# Patient Record
Sex: Female | Born: 1995 | Race: White | Hispanic: Yes | Marital: Single | Smoking: Never smoker
Health system: Southern US, Community
[De-identification: ages and names within clinical notes are randomized; demographics above are authoritative.]

## PROBLEM LIST (undated history)

## (undated) DIAGNOSIS — J45909 Unspecified asthma, uncomplicated: Secondary | ICD-10-CM

## (undated) DIAGNOSIS — J40 Bronchitis, not specified as acute or chronic: Secondary | ICD-10-CM

## (undated) HISTORY — PX: NO PAST SURGERIES: SHX2092

---
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
## 2018-03-05 ENCOUNTER — Encounter (HOSPITAL_COMMUNITY): Payer: Self-pay | Admitting: Emergency Medicine

## 2018-11-05 LAB — OB RESULTS CONSOLE GC/CHLAMYDIA
Chlamydia: NEGATIVE
Gonorrhea: NEGATIVE

## 2018-11-05 LAB — OB RESULTS CONSOLE PLATELET COUNT: Platelets: 286

## 2018-11-05 LAB — OB RESULTS CONSOLE HIV ANTIBODY (ROUTINE TESTING): HIV: NONREACTIVE

## 2018-11-05 LAB — OB RESULTS CONSOLE VARICELLA ZOSTER ANTIBODY, IGG: Varicella: IMMUNE

## 2018-11-05 LAB — OB RESULTS CONSOLE HGB/HCT, BLOOD
HCT: 42 — AB (ref 29–41)
Hemoglobin: 14.1

## 2018-11-05 LAB — OB RESULTS CONSOLE ABO/RH: RH Type: POSITIVE

## 2018-11-05 LAB — OB RESULTS CONSOLE RUBELLA ANTIBODY, IGM: Rubella: IMMUNE

## 2018-11-05 LAB — OB RESULTS CONSOLE ANTIBODY SCREEN: Antibody Screen: NEGATIVE

## 2018-11-05 LAB — OB RESULTS CONSOLE HEPATITIS B SURFACE ANTIGEN: Hepatitis B Surface Ag: NEGATIVE

## 2018-11-16 ENCOUNTER — Other Ambulatory Visit (HOSPITAL_COMMUNITY): Payer: Self-pay | Admitting: Family

## 2018-11-16 ENCOUNTER — Encounter (HOSPITAL_COMMUNITY): Payer: Self-pay | Admitting: *Deleted

## 2018-11-16 DIAGNOSIS — Z3A13 13 weeks gestation of pregnancy: Secondary | ICD-10-CM

## 2018-11-16 DIAGNOSIS — Z369 Encounter for antenatal screening, unspecified: Secondary | ICD-10-CM

## 2018-11-21 ENCOUNTER — Other Ambulatory Visit: Payer: Self-pay

## 2018-11-21 ENCOUNTER — Ambulatory Visit (HOSPITAL_COMMUNITY): Payer: Self-pay | Admitting: *Deleted

## 2018-11-21 ENCOUNTER — Encounter (HOSPITAL_COMMUNITY): Payer: Self-pay

## 2018-11-21 ENCOUNTER — Ambulatory Visit (HOSPITAL_COMMUNITY)
Admission: RE | Admit: 2018-11-21 | Discharge: 2018-11-21 | Disposition: A | Discharge status: Home / Self Care | Payer: Self-pay | Source: Ambulatory Visit | Attending: Obstetrics and Gynecology | Admitting: Obstetrics and Gynecology

## 2018-11-21 ENCOUNTER — Ambulatory Visit (HOSPITAL_COMMUNITY): Payer: Self-pay

## 2018-11-21 VITALS — BP 107/62 | HR 104 | Temp 98.4°F | Wt 144.6 lb

## 2018-11-21 DIAGNOSIS — Z3682 Encounter for antenatal screening for nuchal translucency: Secondary | ICD-10-CM

## 2018-11-21 DIAGNOSIS — Z3A13 13 weeks gestation of pregnancy: Secondary | ICD-10-CM | POA: Insufficient documentation

## 2018-11-21 DIAGNOSIS — Z369 Encounter for antenatal screening, unspecified: Secondary | ICD-10-CM | POA: Insufficient documentation

## 2018-11-23 LAB — FIRST TRIMESTER SCREEN W/NT
CRL: 67.9 mm
DIA MoM: 1.21
DIA Value: 275.3 pg/mL
Gest Age-Collect: 12.9 weeks
Maternal Age At EDD: 21.4 yr
Nuchal Translucency MoM: 0.99
Nuchal Translucency: 1.8 mm
Number of Fetuses: 1
PAPP-A MoM: 1.1
PAPP-A Value: 1390 ng/mL
Test Results:: NEGATIVE
Weight: 144 [lb_av]
hCG MoM: 1.16
hCG Value: 103.2 IU/mL

## 2018-11-26 ENCOUNTER — Telehealth (HOSPITAL_COMMUNITY): Payer: Self-pay | Admitting: Genetic Counselor

## 2018-11-26 NOTE — Telephone Encounter (Signed)
Called Ms. Dominguez-Roda with help of [place] interpreter Harrell Gave [phone] to discuss her negative first trimester screen results. The risk for her pregnancy to be affected by Down syndrome decreased from her 1 in 1024 age-related risk to less than 1 in 10,000, and the risk for trisomy 18 decreased from her 1 in 2005 age-related risk to less than 1 in 10,000 based on the results of this screen. While this screen significantly reduces the likelihood of the pregnancy being affected by trisomy 21 or trisomy 28, it cannot be considered diagnostic. Diagnostic testing via CVS or amniocentesis is available should she be interested in pursuing this. First trimester screening does not screen for open neural tube defects such as spina bifida, so it is recommended that AFP screening be ordered around 16-18 weeks. Ms. Angelia Mould confirmed that she had no questions about these results.  Buelah Manis, MS Genetic Counselor

## 2018-12-07 ENCOUNTER — Other Ambulatory Visit (HOSPITAL_COMMUNITY): Payer: Self-pay | Admitting: Obstetrics and Gynecology

## 2019-03-14 LAB — OB RESULTS CONSOLE RPR: RPR: NONREACTIVE

## 2019-03-15 NOTE — L&D Delivery Note (Addendum)
Delivery Note At 12:32 PM a viable female was delivered via Vaginal, Spontaneous (Presentation: Left Occiput Anterior).  APGAR: 8, 9; weight 7 lb (3175 g).   Placenta status: Spontaneous, Intact.  Cord: 3 vessels with the following complications: None.  Cord pH: NA. One loose nuchal cord and body cord delivered through.    After delivery, patient had brisk bright red bleeding, although fundus was firm. 800 mcg of cytotec placed rectally and methergine given IM. Dr. Penne Lash called to exam patient, who did manual exploration and removed several clots from uterus; patient also received TXA and hemabate x 1. Foley catheter placed, 2nd IV started and PPH labs drawn. After clots manually removed from uterus, bleeding stopped. Patient VSS throughout, infant at the warmer in care of RN.    Anesthesia: Epidural Episiotomy: None Lacerations:  Left labial Suture Repair: 3.0 vicryl Est. Blood Loss (mL):  943  Mom to postpartum.  Baby to Couplet care / Skin to Skin. -Keep patient's Foley in until patient is able to ambulate and bleeding has ceased, anticipate removal this evening.  -Check CBC in 4 hours (1830).   Charlesetta Garibaldi Lynn Sissel 05/10/2019, 2:24 PM

## 2019-05-09 ENCOUNTER — Inpatient Hospital Stay (HOSPITAL_COMMUNITY)
Admission: AD | Admit: 2019-05-09 | Discharge: 2019-05-11 | DRG: 806 | Disposition: A | Discharge status: Home / Self Care | Payer: Medicaid Other | Attending: Obstetrics & Gynecology | Admitting: Obstetrics & Gynecology

## 2019-05-09 ENCOUNTER — Other Ambulatory Visit: Payer: Self-pay

## 2019-05-09 ENCOUNTER — Encounter (HOSPITAL_COMMUNITY): Payer: Self-pay | Admitting: Obstetrics and Gynecology

## 2019-05-09 DIAGNOSIS — Z3A37 37 weeks gestation of pregnancy: Secondary | ICD-10-CM | POA: Diagnosis not present

## 2019-05-09 DIAGNOSIS — O4292 Full-term premature rupture of membranes, unspecified as to length of time between rupture and onset of labor: Principal | ICD-10-CM | POA: Diagnosis present

## 2019-05-09 DIAGNOSIS — Z8759 Personal history of other complications of pregnancy, childbirth and the puerperium: Secondary | ICD-10-CM

## 2019-05-09 DIAGNOSIS — O4202 Full-term premature rupture of membranes, onset of labor within 24 hours of rupture: Secondary | ICD-10-CM | POA: Diagnosis not present

## 2019-05-09 DIAGNOSIS — Z3689 Encounter for other specified antenatal screening: Secondary | ICD-10-CM | POA: Diagnosis not present

## 2019-05-09 DIAGNOSIS — Z20822 Contact with and (suspected) exposure to covid-19: Secondary | ICD-10-CM | POA: Diagnosis present

## 2019-05-09 DIAGNOSIS — Z3490 Encounter for supervision of normal pregnancy, unspecified, unspecified trimester: Secondary | ICD-10-CM

## 2019-05-09 LAB — GROUP B STREP BY PCR: Group B strep by PCR: POSITIVE — AB

## 2019-05-09 LAB — CBC
HCT: 40.3 % (ref 36.0–46.0)
Hemoglobin: 13.6 g/dL (ref 12.0–15.0)
MCH: 31.5 pg (ref 26.0–34.0)
MCHC: 33.7 g/dL (ref 30.0–36.0)
MCV: 93.3 fL (ref 80.0–100.0)
Platelets: 248 10*3/uL (ref 150–400)
RBC: 4.32 MIL/uL (ref 3.87–5.11)
RDW: 12.6 % (ref 11.5–15.5)
WBC: 11.2 10*3/uL — ABNORMAL HIGH (ref 4.0–10.5)
nRBC: 0 % (ref 0.0–0.2)

## 2019-05-09 LAB — TYPE AND SCREEN
ABO/RH(D): B POS
Antibody Screen: NEGATIVE

## 2019-05-09 LAB — SARS CORONAVIRUS 2 (TAT 6-24 HRS): SARS Coronavirus 2: NEGATIVE

## 2019-05-09 LAB — ABO/RH: ABO/RH(D): B POS

## 2019-05-09 MED ORDER — PENICILLIN G POT IN DEXTROSE 60000 UNIT/ML IV SOLN
3.0000 10*6.[IU] | INTRAVENOUS | Status: DC
  Administered 2019-05-10 (×3): 3 10*6.[IU] via INTRAVENOUS
  Filled 2019-05-09 (×3): qty 50

## 2019-05-09 MED ORDER — LIDOCAINE HCL (PF) 1 % IJ SOLN: 30.0000 mL | INTRAMUSCULAR | Status: DC | PRN

## 2019-05-09 MED ORDER — TERBUTALINE SULFATE 1 MG/ML IJ SOLN: 0.2500 mg | Freq: Once | INTRAMUSCULAR | Status: DC | PRN

## 2019-05-09 MED ORDER — OXYCODONE-ACETAMINOPHEN 5-325 MG PO TABS: 1.0000 | ORAL_TABLET | ORAL | Status: DC | PRN

## 2019-05-09 MED ORDER — OXYTOCIN 40 UNITS IN NORMAL SALINE INFUSION - SIMPLE MED
1.0000 m[IU]/min | INTRAVENOUS | Status: DC
  Administered 2019-05-09: 18:00:00 2 m[IU]/min via INTRAVENOUS
  Filled 2019-05-09: qty 1000

## 2019-05-09 MED ORDER — OXYTOCIN 40 UNITS IN NORMAL SALINE INFUSION - SIMPLE MED
2.5000 [IU]/h | INTRAVENOUS | Status: DC
  Filled 2019-05-09: qty 1000

## 2019-05-09 MED ORDER — OXYCODONE-ACETAMINOPHEN 5-325 MG PO TABS: 2.0000 | ORAL_TABLET | ORAL | Status: DC | PRN

## 2019-05-09 MED ORDER — SOD CITRATE-CITRIC ACID 500-334 MG/5ML PO SOLN
30.0000 mL | ORAL | Status: DC | PRN
  Administered 2019-05-09: 30 mL via ORAL
  Filled 2019-05-09: qty 30

## 2019-05-09 MED ORDER — SODIUM CHLORIDE 0.9 % IV SOLN
5.0000 10*6.[IU] | Freq: Once | INTRAVENOUS | Status: AC
  Administered 2019-05-09: 21:00:00 5 10*6.[IU] via INTRAVENOUS
  Filled 2019-05-09: qty 5

## 2019-05-09 MED ORDER — LACTATED RINGERS IV SOLN
500.0000 mL | INTRAVENOUS | Status: DC | PRN
  Administered 2019-05-10: 07:00:00 500 mL via INTRAVENOUS

## 2019-05-09 MED ORDER — LACTATED RINGERS IV SOLN: INTRAVENOUS | Status: DC

## 2019-05-09 MED ORDER — ACETAMINOPHEN 325 MG PO TABS: 650.0000 mg | ORAL_TABLET | ORAL | Status: DC | PRN

## 2019-05-09 MED ORDER — ONDANSETRON HCL 4 MG/2ML IJ SOLN
4.0000 mg | Freq: Four times a day (QID) | INTRAMUSCULAR | Status: DC | PRN
  Administered 2019-05-10 (×2): 4 mg via INTRAVENOUS
  Filled 2019-05-09 (×2): qty 2

## 2019-05-09 MED ORDER — OXYTOCIN BOLUS FROM INFUSION
500.0000 mL | Freq: Once | INTRAVENOUS | Status: AC
  Administered 2019-05-10: 13:00:00 500 mL via INTRAVENOUS

## 2019-05-09 MED ORDER — FLEET ENEMA 7-19 GM/118ML RE ENEM: 1.0000 | ENEMA | RECTAL | Status: DC | PRN

## 2019-05-09 NOTE — H&P (Addendum)
OBSTETRIC ADMISSION HISTORY AND PHYSICAL  Judith Collins is a 24 y.o. female G1P0 with IUP at [redacted]w[redacted]d by LMP c/w early u/s presenting for PROM at term. She reports loss of fluid since Friday (2/19). She reports +FMs, no VB, no blurry vision, headaches or peripheral edema, no RUQ pain.  She plans on breast feeding. She request POPs for birth control. She [facility].   Dating: By LMP c/w early u/s --->  Estimated Date of Delivery: 05/29/19  Sono:    @[redacted]w[redacted]d , CWD, 90mm UTD  Prenatal History/Complications: None  Past Medical History: Past Medical History:  Diagnosis Date  . Bronchitis     Past Surgical History: Past Surgical History:  Procedure Laterality Date  . NO PAST SURGERIES      Obstetrical History: OB History    Gravida  1   Para      Term      Preterm      AB      Living        SAB      TAB      Ectopic      Multiple      Live Births              Social History Social History   Socioeconomic History  . Marital status: Single    Spouse name: Not on file  . Number of children: Not on file  . Years of education: Not on file  . Highest education level: Not on file  Occupational History  . Not on file  Tobacco Use  . Smoking status: Never Smoker  . Smokeless tobacco: Never Used  Substance and Sexual Activity  . Alcohol use: No  . Drug use: No  . Sexual activity: Yes  Other Topics Concern  . Not on file  Social History Narrative   ** Merged History Encounter **       Social Determinants of Health   Financial Resource Strain:   . Difficulty of Paying Living Expenses: Not on file  Food Insecurity:   . Worried About Charity fundraiser in the Last Year: Not on file  . Ran Out of Food in the Last Year: Not on file  Transportation Needs:   . Lack of Transportation (Medical): Not on file  . Lack of Transportation (Non-Medical): Not on file  Physical Activity:   . Days of Exercise per  Week: Not on file  . Minutes of Exercise per Session: Not on file  Stress:   . Feeling of Stress : Not on file  Social Connections:   . Frequency of Communication with Friends and Family: Not on file  . Frequency of Social Gatherings with Friends and Family: Not on file  . Attends Religious Services: Not on file  . Active Member of Clubs or Organizations: Not on file  . Attends Archivist Meetings: Not on file  . Marital Status: Not on file    Family History: Family History  Problem Relation Age of Onset  . Asthma Mother     Allergies: Allergies  Allergen Reactions  . Lactose Intolerance (Gi)     Medications Prior to Admission  Medication Sig Dispense Refill Last Dose  . Prenatal Vit-Fe Fumarate-FA (PRENATAL VITAMIN PO) Take by mouth.   05/09/2019 at Unknown time  . mupirocin nasal ointment (BACTROBAN) 2 % Apply in each nostril daily (Patient not taking: Reported on 11/21/2018) 1 g 0   . phenol (  CHLORASEPTIC) 1.4 % LIQD Use as directed 1 spray in the mouth or throat as needed for throat irritation / pain. (Patient not taking: Reported on 11/21/2018) 177 mL 0   . polyethylene glycol powder (GLYCOLAX/MIRALAX) powder Take 17 g by mouth daily as needed. (Patient not taking: Reported on 03/13/2015) 255 g 0   . ranitidine (ZANTAC) 150 MG tablet Take 1 tablet (150 mg total) by mouth 2 (two) times daily. (Patient not taking: Reported on 11/21/2018) 14 tablet 3      Review of Systems   All systems reviewed and negative except as stated in HPI  Blood pressure 114/69, pulse (!) 105, temperature 98.8 F (37.1 C), temperature source Oral, resp. rate 18, height 5\' 2"  (1.575 m), weight 81.2 kg, last menstrual period 08/22/2018. General appearance: alert, cooperative and no distress Lungs: clear to auscultation bilaterally Heart: regular rate and rhythm Abdomen: soft, non-tender; bowel sounds normal Pelvic: 3.5cm, 90%, -1 Extremities: Homans sign is negative, no sign of  DVT Presentation: cephalic Fetal monitoringBaseline: 150 bpm, Variability: Good {> 6 bpm) and Accelerations: present Uterine activity: Contractions rare Dilation: 3.5 Effacement (%): 90 Station: -1 Exam by:: [email or web address] CNM  Vertex  Prenatal labs: ABO, Rh: B pos Antibody: PENDING (02/25 1728) Rubella: Immune (08/24 0000) RPR: Nonreactive (12/31 0000)  HBsAg: Negative (08/24 0000)  HIV: Non-reactive (08/24 0000)  GBS:  unknown 1 hr Glucola 89 Genetic screening  Low risk Anatomy 02-26-1987 63mm UTD  Prenatal Transfer Tool  Maternal Diabetes: No Genetic Screening: Normal Maternal Ultrasounds/Referrals: Other: Not on file. Fetal Ultrasounds or other Referrals:  None Maternal Substance Abuse:  No Significant Maternal Medications:  None Significant Maternal Lab Results: none  Results for orders placed or performed during the hospital encounter of 05/09/19 (from the past 24 hour(s))  CBC   Collection Time: 05/09/19  5:28 PM  Result Value Ref Range   WBC 11.2 (H) 4.0 - 10.5 K/uL   RBC 4.32 3.87 - 5.11 MIL/uL   Hemoglobin 13.6 12.0 - 15.0 g/dL   HCT 05/11/19 77.8 - 24.2 %   MCV 93.3 80.0 - 100.0 fL   MCH 31.5 26.0 - 34.0 pg   MCHC 33.7 30.0 - 36.0 g/dL   RDW 35.3 61.4 - 43.1 %   Platelets 248 150 - 400 K/uL   nRBC 0.0 0.0 - 0.2 %  Type and screen [facility]   Collection Time: 05/09/19  5:28 PM  Result Value Ref Range   ABO/RH(D) PENDING    Antibody Screen PENDING    Sample Expiration      05/12/2019,2359 Performed at [facility], [address] [place], [place]     Patient Active Problem List   Diagnosis Date Noted  . Encounter for induction of labor 05/09/2019  . Perforated tympanic membrane 02/26/2015  . Failed hearing screening 02/26/2015    Assessment/Plan:  Judith Collins is a 24 y.o. G1P0 at [redacted]w[redacted]d here for PROM at term.  #Labor: Latent. Will initiate pitocin. #Pain: Epidural when indicated #FWB: Cat 1 #ID:  GBS unsure, will order rapid PCR #MOF: Breast #MOC: POPs #Circ:  No  [redacted]w[redacted]d, DO  05/09/2019, 6:13 PM  Midwife attestation: I have seen and examined this patient; I agree with above documentation in the resident's note.   PE: Gen: calm comfortable, NAD Resp: normal effort and rate Abd: gravid  ROS, labs, PMH reviewed  Assessment/Plan: Mohogany Toppins is a 24 y.o. G1P0 here for PROM at  term Admit to LD Labor: latent, favorable cervix FWB: Cat I GBS unknown Pitocin IOL Anticipate SVD Live interpreter present for encounter  Donette Larry, CNM  05/09/2019, 7:07 PM

## 2019-05-09 NOTE — Progress Notes (Signed)
Labor Progress Note Judith Collins Records is a 24 y.o. G1P0 at 73w1dpresented for IOL for PROM. S: Feeling ctx. Met patient and discussed plan.   O:  BP (!) 106/53   Pulse 87   Temp 98.1 F (36.7 C) (Oral)   Resp 16   Ht 5' 2"  (1.575 m)   Wt 81.2 kg   LMP 08/22/2018 (Exact Date)   BMI 32.74 kg/m  EFM: 140, moderate variability, pos accels, no decels, reactive TOCO: q2-411mCVE: Dilation: 3.5 Effacement (%): 90 Station: -1 Presentation: Vertex Exam by:: MeJulianne HandlerNM    A&P: [age].o. G1P0 3764w1dre for IOL for PROM. #Labor: Vertex by CNM exam. Cont Pit titration. Will repeat SVE 4 hours after Pit initiation. Anticipate labor progression and SVD. #Pain: per patient request #FWB: Cat I #GBS PCR pos; will start PCN  CheChauncey MannD 8:46 PM

## 2019-05-10 ENCOUNTER — Encounter (HOSPITAL_COMMUNITY): Payer: Self-pay | Admitting: Obstetrics and Gynecology

## 2019-05-10 ENCOUNTER — Inpatient Hospital Stay (HOSPITAL_COMMUNITY): Payer: Medicaid Other | Admitting: Anesthesiology

## 2019-05-10 DIAGNOSIS — O4202 Full-term premature rupture of membranes, onset of labor within 24 hours of rupture: Secondary | ICD-10-CM

## 2019-05-10 DIAGNOSIS — Z8759 Personal history of other complications of pregnancy, childbirth and the puerperium: Secondary | ICD-10-CM

## 2019-05-10 LAB — CBC
HCT: 37.2 % (ref 36.0–46.0)
HCT: 33.2 % — ABNORMAL LOW (ref 36.0–46.0)
Hemoglobin: 12.4 g/dL (ref 12.0–15.0)
Hemoglobin: 11.3 g/dL — ABNORMAL LOW (ref 12.0–15.0)
MCH: 31.5 pg (ref 26.0–34.0)
MCH: 31.7 pg (ref 26.0–34.0)
MCHC: 33.3 g/dL (ref 30.0–36.0)
MCHC: 34 g/dL (ref 30.0–36.0)
MCV: 94.4 fL (ref 80.0–100.0)
MCV: 93 fL (ref 80.0–100.0)
Platelets: 208 10*3/uL (ref 150–400)
Platelets: 205 10*3/uL (ref 150–400)
RBC: 3.94 MIL/uL (ref 3.87–5.11)
RBC: 3.57 MIL/uL — ABNORMAL LOW (ref 3.87–5.11)
RDW: 12.3 % (ref 11.5–15.5)
RDW: 12.4 % (ref 11.5–15.5)
WBC: 14.5 10*3/uL — ABNORMAL HIGH (ref 4.0–10.5)
WBC: 21.6 10*3/uL — ABNORMAL HIGH (ref 4.0–10.5)
nRBC: 0 % (ref 0.0–0.2)
nRBC: 0 % (ref 0.0–0.2)

## 2019-05-10 LAB — DIC (DISSEMINATED INTRAVASCULAR COAGULATION)PANEL
D-Dimer, Quant: UNDETERMINED ug/mL-FEU (ref 0.00–0.50)
D-Dimer, Quant: 3.94 ug/mL-FEU — ABNORMAL HIGH (ref 0.00–0.50)
Fibrinogen: UNDETERMINED mg/dL (ref 210–475)
Fibrinogen: 519 mg/dL — ABNORMAL HIGH (ref 210–475)
INR: UNDETERMINED (ref 0.8–1.2)
INR: 1.1 (ref 0.8–1.2)
Platelets: 209 10*3/uL (ref 150–400)
Platelets: 212 10*3/uL (ref 150–400)
Prothrombin Time: UNDETERMINED seconds (ref 11.4–15.2)
Prothrombin Time: 14.6 seconds (ref 11.4–15.2)
Smear Review: NONE SEEN
Smear Review: NONE SEEN
aPTT: UNDETERMINED seconds (ref 24–36)
aPTT: 27 seconds (ref 24–36)

## 2019-05-10 LAB — RPR: RPR Ser Ql: NONREACTIVE

## 2019-05-10 MED ORDER — SIMETHICONE 80 MG PO CHEW: 80.0000 mg | CHEWABLE_TABLET | ORAL | Status: DC | PRN

## 2019-05-10 MED ORDER — CEFAZOLIN SODIUM-DEXTROSE 2-4 GM/100ML-% IV SOLN: 2.0000 g | Freq: Once | INTRAVENOUS | Status: DC

## 2019-05-10 MED ORDER — TRANEXAMIC ACID-NACL 1000-0.7 MG/100ML-% IV SOLN: 1000.0000 mg | Freq: Once | INTRAVENOUS | Status: DC | PRN

## 2019-05-10 MED ORDER — LIDOCAINE HCL (PF) 1 % IJ SOLN
INTRAMUSCULAR | Status: DC | PRN
  Administered 2019-05-10: 11 mL via EPIDURAL

## 2019-05-10 MED ORDER — MISOPROSTOL 200 MCG PO TABS
800.0000 ug | ORAL_TABLET | Freq: Once | ORAL | Status: AC
  Administered 2019-05-10: 13:00:00 800 ug via RECTAL

## 2019-05-10 MED ORDER — DIPHENHYDRAMINE HCL 25 MG PO CAPS: 25.0000 mg | ORAL_CAPSULE | Freq: Four times a day (QID) | ORAL | Status: DC | PRN

## 2019-05-10 MED ORDER — BENZOCAINE-MENTHOL 20-0.5 % EX AERO
1.0000 "application " | INHALATION_SPRAY | CUTANEOUS | Status: DC | PRN
  Administered 2019-05-10: 1 via TOPICAL
  Filled 2019-05-10: qty 56

## 2019-05-10 MED ORDER — CARBOPROST TROMETHAMINE 250 MCG/ML IM SOLN
INTRAMUSCULAR | Status: AC
  Administered 2019-05-10: 250 ug
  Filled 2019-05-10: qty 1

## 2019-05-10 MED ORDER — SENNOSIDES-DOCUSATE SODIUM 8.6-50 MG PO TABS
2.0000 | ORAL_TABLET | ORAL | Status: DC
  Administered 2019-05-10: 23:00:00 2 via ORAL
  Filled 2019-05-10: qty 2

## 2019-05-10 MED ORDER — FENTANYL-BUPIVACAINE-NACL 0.5-0.125-0.9 MG/250ML-% EP SOLN
EPIDURAL | Status: AC
  Filled 2019-05-10: qty 250

## 2019-05-10 MED ORDER — ONDANSETRON HCL 4 MG PO TABS: 4.0000 mg | ORAL_TABLET | ORAL | Status: DC | PRN

## 2019-05-10 MED ORDER — TRANEXAMIC ACID-NACL 1000-0.7 MG/100ML-% IV SOLN
1000.0000 mg | INTRAVENOUS | Status: AC
  Administered 2019-05-10: 1000 mg via INTRAVENOUS

## 2019-05-10 MED ORDER — METHYLERGONOVINE MALEATE 0.2 MG/ML IJ SOLN
0.2000 mg | Freq: Once | INTRAMUSCULAR | Status: AC
  Administered 2019-05-10: 13:00:00 0.2 mg via INTRAMUSCULAR

## 2019-05-10 MED ORDER — DIPHENHYDRAMINE HCL 50 MG/ML IJ SOLN: 12.5000 mg | INTRAMUSCULAR | Status: DC | PRN

## 2019-05-10 MED ORDER — FENTANYL CITRATE (PF) 100 MCG/2ML IJ SOLN
100.0000 ug | INTRAMUSCULAR | Status: DC | PRN
  Administered 2019-05-10: 03:00:00 100 ug via INTRAVENOUS

## 2019-05-10 MED ORDER — COCONUT OIL OIL: 1.0000 "application " | TOPICAL_OIL | Status: DC | PRN

## 2019-05-10 MED ORDER — PHENYLEPHRINE 40 MCG/ML (10ML) SYRINGE FOR IV PUSH (FOR BLOOD PRESSURE SUPPORT): 80.0000 ug | PREFILLED_SYRINGE | INTRAVENOUS | Status: DC | PRN

## 2019-05-10 MED ORDER — METHYLERGONOVINE MALEATE 0.2 MG PO TABS
0.2000 mg | ORAL_TABLET | Freq: Three times a day (TID) | ORAL | Status: DC
  Administered 2019-05-10 – 2019-05-11 (×3): 0.2 mg via ORAL
  Filled 2019-05-10 (×3): qty 1

## 2019-05-10 MED ORDER — LACTATED RINGERS IV BOLUS
1000.0000 mL | Freq: Once | INTRAVENOUS | Status: AC
  Administered 2019-05-10: 13:00:00 1000 mL via INTRAVENOUS

## 2019-05-10 MED ORDER — LACTATED RINGERS IV SOLN: INTRAVENOUS | Status: DC

## 2019-05-10 MED ORDER — FENTANYL CITRATE (PF) 100 MCG/2ML IJ SOLN
INTRAMUSCULAR | Status: AC
  Filled 2019-05-10: qty 2

## 2019-05-10 MED ORDER — DIPHENOXYLATE-ATROPINE 2.5-0.025 MG PO TABS
2.0000 | ORAL_TABLET | Freq: Once | ORAL | Status: AC
  Administered 2019-05-10: 13:00:00 2 via ORAL
  Filled 2019-05-10: qty 2

## 2019-05-10 MED ORDER — DIBUCAINE (PERIANAL) 1 % EX OINT: 1.0000 "application " | TOPICAL_OINTMENT | CUTANEOUS | Status: DC | PRN

## 2019-05-10 MED ORDER — SODIUM CHLORIDE 0.9 % IV BOLUS
1000.0000 mL | Freq: Once | INTRAVENOUS | Status: AC
  Administered 2019-05-10: 19:00:00 1000 mL via INTRAVENOUS

## 2019-05-10 MED ORDER — OXYCODONE-ACETAMINOPHEN 5-325 MG PO TABS
1.0000 | ORAL_TABLET | ORAL | Status: DC | PRN
  Administered 2019-05-10: 1 via ORAL
  Filled 2019-05-10: qty 1

## 2019-05-10 MED ORDER — ACETAMINOPHEN 325 MG PO TABS: 650.0000 mg | ORAL_TABLET | ORAL | Status: DC | PRN

## 2019-05-10 MED ORDER — ZOLPIDEM TARTRATE 5 MG PO TABS: 5.0000 mg | ORAL_TABLET | Freq: Every evening | ORAL | Status: DC | PRN

## 2019-05-10 MED ORDER — LACTATED RINGERS IV SOLN
500.0000 mL | Freq: Once | INTRAVENOUS | Status: AC
  Administered 2019-05-10: 01:00:00 500 mL via INTRAVENOUS

## 2019-05-10 MED ORDER — MISOPROSTOL 200 MCG PO TABS
ORAL_TABLET | ORAL | Status: AC
  Filled 2019-05-10: qty 4

## 2019-05-10 MED ORDER — ONDANSETRON HCL 4 MG/2ML IJ SOLN: 4.0000 mg | INTRAMUSCULAR | Status: DC | PRN

## 2019-05-10 MED ORDER — TETANUS-DIPHTH-ACELL PERTUSSIS 5-2.5-18.5 LF-MCG/0.5 IM SUSP: 0.5000 mL | Freq: Once | INTRAMUSCULAR | Status: DC

## 2019-05-10 MED ORDER — PRENATAL MULTIVITAMIN CH
1.0000 | ORAL_TABLET | Freq: Every day | ORAL | Status: DC
  Administered 2019-05-11: 1 via ORAL
  Filled 2019-05-10: qty 1

## 2019-05-10 MED ORDER — CARBOPROST TROMETHAMINE 250 MCG/ML IM SOLN
INTRAMUSCULAR | Status: AC
  Filled 2019-05-10: qty 1

## 2019-05-10 MED ORDER — METHYLERGONOVINE MALEATE 0.2 MG/ML IJ SOLN
INTRAMUSCULAR | Status: AC
  Filled 2019-05-10: qty 1

## 2019-05-10 MED ORDER — FENTANYL-BUPIVACAINE-NACL 0.5-0.125-0.9 MG/250ML-% EP SOLN: 12.0000 mL/h | EPIDURAL | Status: DC | PRN

## 2019-05-10 MED ORDER — WITCH HAZEL-GLYCERIN EX PADS: 1.0000 "application " | MEDICATED_PAD | CUTANEOUS | Status: DC | PRN

## 2019-05-10 MED ORDER — EPHEDRINE 5 MG/ML INJ: 10.0000 mg | INTRAVENOUS | Status: DC | PRN

## 2019-05-10 MED ORDER — CARBOPROST TROMETHAMINE 250 MCG/ML IM SOLN: 250.0000 ug | Freq: Once | INTRAMUSCULAR | Status: DC

## 2019-05-10 MED ORDER — SODIUM CHLORIDE (PF) 0.9 % IJ SOLN
INTRAMUSCULAR | Status: DC | PRN
  Administered 2019-05-10: 12 mL/h via EPIDURAL

## 2019-05-10 MED ORDER — TRANEXAMIC ACID-NACL 1000-0.7 MG/100ML-% IV SOLN
INTRAVENOUS | Status: AC
  Filled 2019-05-10: qty 100

## 2019-05-10 MED ORDER — BUPIVACAINE HCL (PF) 0.25 % IJ SOLN
INTRAMUSCULAR | Status: DC | PRN
  Administered 2019-05-10: 10 mL via EPIDURAL

## 2019-05-10 MED ORDER — IBUPROFEN 600 MG PO TABS
600.0000 mg | ORAL_TABLET | Freq: Four times a day (QID) | ORAL | Status: DC
  Administered 2019-05-10 – 2019-05-11 (×4): 600 mg via ORAL
  Filled 2019-05-10 (×5): qty 1

## 2019-05-10 NOTE — Anesthesia Preprocedure Evaluation (Signed)

## 2019-05-10 NOTE — Progress Notes (Addendum)
Dr. Emelda Fear on the unit and updated on patients pain status, small trickle, firm U/2 fundus and unable to stand for Ortho VSPatient reports being dizzy and became tachy. Ordered to give a bolus over 2 hours then keep her KVO and then update/consult him before pulling out the foley. He will order pain medication. Royston Cowper, RN   Interpreter was on the phone while assessing the status of her legs. She is able to move/ bend knees, lift hips off the bed, perfoem dorsal and plantar flexion. She said she has mild numbness at the knees but also feels like she cant move them well due to pain. Will continue to monitor.

## 2019-05-10 NOTE — Progress Notes (Signed)
Labor Progress Note Azjah Pardo Rodas Roselle Locus is a 24 y.o. G1P0 at [redacted]w[redacted]d presented for IOL for PROM. S: Still comfortable; became uncomfortable and feeling ctx after AROM.  O:  BP 113/80   Pulse 77   Temp 97.9 F (36.6 C) (Oral)   Resp 16   Ht 5\' 2"  (1.575 m)   Wt 81.2 kg   LMP 08/22/2018 (Exact Date)   BMI 32.74 kg/m  EFM: 140, moderate variability, pos accels, no decels, reactive TOCO: difficult to trace  CVE: Dilation: 3.5 Effacement (%): 70 Cervical Position: Middle Station: -1 Presentation: Vertex Exam by:: [email or web address] RN   A&P: 24 y.o. G1P0 [redacted]w[redacted]d here for IOL for PROM. #Labor: Supposedly fern positive at HD. Definitive membranes palpated on exam and AROM performed. BSUS with anterior placenta, IUPC placed posteriorly, will cont Pit titration. Anticipate labor progression and SVD. #Pain: per patient request #FWB: Cat I #GBS PCR pos; will start PCN  [redacted]w[redacted]d, MD 12:44 AM

## 2019-05-10 NOTE — Progress Notes (Signed)
Check on pt needs, by Viria Alvarez Spanish Interpreter. 

## 2019-05-10 NOTE — Anesthesia Procedure Notes (Signed)
Epidural Patient location during procedure: OB Start time: 05/10/2019 1:15 AM End time: 05/10/2019 1:31 AM  Staffing Anesthesiologist: Lowella Curb, MD Performed: anesthesiologist   Preanesthetic Checklist Completed: patient identified, IV checked, site marked, risks and benefits discussed, surgical consent, monitors and equipment checked, pre-op evaluation and timeout performed  Epidural Patient position: sitting Prep: ChloraPrep Patient monitoring: heart rate, cardiac monitor, continuous pulse ox and blood pressure Approach: midline Location: L2-L3 Injection technique: LOR saline  Needle:  Needle type: Tuohy  Needle gauge: 17 G Needle length: 9 cm Needle insertion depth: 5 cm Catheter type: closed end flexible Catheter size: 20 Guage Catheter at skin depth: 9 cm Test dose: negative  Assessment Events: blood not aspirated, injection not painful, no injection resistance, no paresthesia and negative IV test  Additional Notes Reason for block:procedure for pain

## 2019-05-10 NOTE — Progress Notes (Signed)
   Judith Collins Roselle Locus is a 24 y.o. G1P0 at [redacted]w[redacted]d  admitted for PROM  Subjective:  Sleeping on left side  Objective: Vitals:   05/10/19 0630 05/10/19 0700 05/10/19 0731 05/10/19 0801  BP: (!) 93/42 (!) 92/44 112/70 112/70  Pulse: 99 (!) 112 (!) 117 (!) 115  Resp:   18   Temp:      TempSrc:      SpO2:      Weight:      Height:       No intake/output data recorded.  FHT:  FHR: 125 bpm, variability: moderate,  accelerations:  Present,  decelerations:  Absent UC:   regular, every 1-2 minutes SVE:   Dilation: 5 Effacement (%): 100 Station: 0 Exam by:: Gearldine Bienenstock, RN Pitocin @ 30 mu/min  Labs: Lab Results  Component Value Date   WBC 11.2 (H) 05/09/2019   HGB 13.6 05/09/2019   HCT 40.3 05/09/2019   MCV 93.3 05/09/2019   PLT 248 05/09/2019    Assessment / Plan: Induction of labor due to PROM,  progressing well on pitocin MVU are adequate at 180 Labor: Progressing normally Fetal Wellbeing:  Category I Pain Control:  Epidural Anticipated MOD:  NSVD  Charlesetta Garibaldi Zadin Lange 05/10/2019, 8:48 AM

## 2019-05-10 NOTE — Discharge Summary (Addendum)
Postpartum Discharge Summary   Patient Name: Judith Collins DOB: May 04, 1995 MRN: [id number]  Date of admission: 05/09/2019 Delivering Provider: Starr Lake   Date of discharge: 05/11/2019  Admitting diagnosis: Encounter for induction of labor [Z34.90] Intrauterine pregnancy: [redacted]w[redacted]d    Secondary diagnosis:  Active Problems:   Encounter for induction of labor   Postpartum hemorrhage  Additional problems:      Discharge diagnosis: Term Pregnancy Delivered                                                                                                Post partum procedures: NA  Augmentation: AROM and Pitocin  Complications: [id number] Hospital course:  Induction of Labor With Vaginal Delivery   24y.o. yo G1P0 at 325w2das admitted to the hospital 05/09/2019 for induction of labor.  Indication for induction: PROM.  Patient had an uncomplicated labor course as follows: Membrane Rupture Time/Date: 12:39 AM ,05/10/2019   Intrapartum Procedures: Episiotomy: None [1]                                         Lacerations:  1st degree [2]  Patient had delivery of a Viable infant.  Information for the patient's newborn:  Rodas DoMinal, Stuller0[[id number]]Delivery Method: Vaginal, Spontaneous(Filed from Delivery Summary)    05/10/2019  Details of delivery can be found in separate delivery note.  Patient has PPH of 950 EBL; received cytotec, methergine, hemabate, TXA. Patient had a routine postpartum course. Patient is discharged home 05/11/19. Delivery time: 12:32 PM    Magnesium Sulfate received: No BMZ received: No Rhophylac:No MMR:No Transfusion:No  Physical exam  Vitals:   05/10/19 2324 05/11/19 0349 05/11/19 0803 05/11/19 1337  BP:  106/64 108/64 99/63  Pulse:  (!) 113 78 87  Resp: 16 16 16 16   Temp: 98.8 F (37.1 C) 98.6 F (37 C) 98.3 F (36.8 C) 98.2 F (36.8 C)  TempSrc: Oral Oral Oral Oral  SpO2:    100%  Weight:       Height:       General: alert, cooperative and no distress Lochia: appropriate Uterine Fundus: firm Incision: N/A DVT Evaluation: No evidence of DVT seen on physical exam. Labs: Lab Results  Component Value Date   WBC 16.6 (H) 05/11/2019   HGB 9.3 (L) 05/11/2019   HCT 27.5 (L) 05/11/2019   MCV 92.9 05/11/2019   PLT 168 05/11/2019   CMP Latest Ref Rng & Units 02/16/2015  Glucose 65 - 99 mg/dL 102(H)  BUN 6 - 20 mg/dL 13  Creatinine 0.50 - 1.00 mg/dL 0.63  Sodium 135 - 145 mmol/L 139  Potassium 3.5 - 5.1 mmol/L 4.4  Chloride 101 - 111 mmol/L 106  CO2 22 - 32 mmol/L 25  Calcium 8.9 - 10.3 mg/dL 9.8  Total Protein 6.5 - 8.1 g/dL 7.6  Total Bilirubin 0.3 - 1.2 mg/dL 0.4  Alkaline Phos 47 - 119 U/L 113  AST 15 - 41 U/L  18  ALT 14 - 54 U/L 11(L)   Flavia Shipper Score: Edinburgh Postnatal Depression Scale Screening Tool 05/11/2019  I have been able to laugh and see the funny side of things. 0  I have looked forward with enjoyment to things. 0  I have blamed myself unnecessarily when things went wrong. 1  I have been anxious or worried for no good reason. 0  I have felt scared or panicky for no good reason. 1  Things have been getting on top of me. 0  I have been so unhappy that I have had difficulty sleeping. 0  I have felt sad or miserable. 0  I have been so unhappy that I have been crying. 1  The thought of harming myself has occurred to me. 0  Edinburgh Postnatal Depression Scale Total 3    Discharge instruction: per After Visit Summary and "Baby and Me Booklet".  After visit meds:  Allergies as of 05/11/2019       Reactions   Lactose Intolerance (gi)         Medication List     STOP taking these medications    mupirocin nasal ointment 2 % Commonly known as: BACTROBAN   phenol 1.4 % Liqd Commonly known as: Chloraseptic   ranitidine 150 MG tablet Commonly known as: ZANTAC       TAKE these medications    ferrous sulfate 325 (65 FE) MG tablet Take 1  tablet (325 mg total) by mouth every other day. Start taking on: May 13, 2019   ibuprofen 600 MG tablet Commonly known as: ADVIL Take 1 tablet (600 mg total) by mouth every 6 (six) hours.   norethindrone 0.35 MG tablet Commonly known as: Ortho Micronor Take 1 tablet (0.35 mg total) by mouth daily.   polyethylene glycol powder 17 GM/SCOOP powder Commonly known as: GLYCOLAX/MIRALAX Take 17 g by mouth daily as needed.   PRENATAL VITAMIN PO Take by mouth.        Diet: routine diet  Activity: Advance as tolerated. Pelvic rest for 6 weeks.   Outpatient follow up:4 weeks at [facility] Follow up Appt:No future appointments. Follow up Visit: Follow-up Information     Department, [facility]. Schedule an appointment as soon as possible for a visit in 4 week(s).   Why: Please call to make an appointment for 4 weeks for follow up Contact information: [place] [address] [phone]           Newborn Data: Live born female  Birth Weight: 7 lb (3175 g) APGAR: 91, 88  Newborn Delivery   Birth date/time: 05/10/2019 12:32:00 Delivery type: Vaginal, Spontaneous      Baby Feeding: Breast Disposition:home with mother   05/11/2019 Lurline Del, DO  I confirm that I have verified the information documented in the resident's note and that I have also personally reperformed the history, physical exam and all medical decision making activities of this service and have verified that all service and findings are accurately documented in this student's note.   Wende Mott, [place] 05/11/2019 2:11 PM

## 2019-05-10 NOTE — Plan of Care (Signed)
Ellis Koffler, RN 

## 2019-05-10 NOTE — Progress Notes (Signed)
Patient became dizzy while sitting on the bedside. Patient states her legs still feel heavy. RN did not stand patient up for orthostatic vitals. Will try again later.    05/10/19 1846  Vital Signs  BP Location Right Arm  Patient Position (if appropriate) Orthostatic Vitals  BP Method Automatic  Pulse Rate Source Dinamap  Orthostatic Lying   BP- Lying 108/70  Pulse- Lying 102  Orthostatic Sitting  BP- Sitting 124/73  Pulse- Sitting 130

## 2019-05-10 NOTE — Lactation Note (Signed)
This note was copied from a baby's chart. Lactation Consultation Note  Patient Name: Judith Collins [id number] Date: 05/10/2019 Reason for consult: Initial assessment;Early term 37-38.6wks;Primapara;1st time breastfeeding  P1 mother whose infant is now 58 hours old.  This is an ETI at 37+2 weeks.  Mother had a PPH.  In house Spanish interpreter, Judith Collins, used for interpretation.  Baby was swaddled and asleep when I arrived.  Discussed breast feeding basics including STS, feeding intervals, feeding cues and feeding plan for this evening.  RN (orientee) taught hand expression and mother was able to express a drop of colostrum which I finger fed back to back.    Encouraged mother to observe for feeding cues and feed on demand.  However, reminded her that baby may not self awaken and she should awaken him every three hours to attempt breast feeding.  If he does not latch or remains too sleepy to feed mother will feed back any EBM she obtains from hand expression.  Colostrum container provided and milk storage times reviewed.  Finger feeding demonstrated.    Due to mother's hemorrhage and baby's ET status I recommended mother begin pumping with the DEBP.  She was willing to do this.  Pump parts, assembly, disassembly and cleaning reviewed.  Observed mother pumping for most of her 15 minutes and no colostrum noted at this time.  Reassured her that this is typical and to be consistent about pumping.  Mother verbalized understanding.  #24 flange size is appropriate at this time.    Mom made aware of O/P services, breastfeeding support groups, community resources, and our phone # for post-discharge questions. All information provided in Spanish.  Mother does not have a DEBP for home use.  She is a [facility] participant in [employer].  [facility] referral sent and mother will follow up.  Informed her of our [facility] loaner program if interested.  Mother is not sure whether or not she will have a support person  present for tonight.  Continuing education for her will be beneficial.  RN in room and aware.   Maternal Data Formula Feeding for Exclusion: No Has patient been taught Hand Expression?: Yes Does the patient have breastfeeding experience prior to this delivery?: No  Feeding    LATCH Score                   Interventions    Lactation Tools Discussed/Used WIC Program: Yes Pump Review: Setup, frequency, and cleaning;Milk Storage Initiated by:: Judith Collins Date initiated:: 05/10/19   Consult Status Consult Status: Follow-up Date: 05/11/19 Follow-up type: In-patient    Judith Collins 05/10/2019, 5:44 PM

## 2019-05-10 NOTE — Progress Notes (Signed)
Labor Progress Note Judith Collins is a 24 y.o. G1P0 at [redacted]w[redacted]d presented for IOL for PROM. S: Finally comfortable with epidural.   O:  BP (!) 94/41   Pulse (!) 111   Temp 97.8 F (36.6 C) (Oral)   Resp 18   Ht 5\' 2"  (1.575 m)   Wt 81.2 kg   LMP 08/22/2018 (Exact Date)   SpO2 100%   BMI 32.74 kg/m  EFM: 130, moderate variability, pos accels, no decels, reactive TOCO: q45m  CVE: Dilation: 5 Effacement (%): 100 Cervical Position: Middle Station: 0 Presentation: Vertex(with capid) Exam by:: [email or web address], RN   A&P: 24 y.o. G1P0 [redacted]w[redacted]d here for IOL for PROM. #Labor: Progressing well. S/p AROM and IUPC placement. Cont Pit titration; MVU's ~ 180. Anticipate labor progression and SVD. #Pain: epidural  #FWB: Cat I #GBS PCR pos; PCN  [redacted]w[redacted]d, MD 6:45 AM

## 2019-05-10 NOTE — Anesthesia Postprocedure Evaluation (Signed)
Anesthesia Post Note  Patient: Judith Collins  Procedure(s) Performed: AN AD HOC LABOR EPIDURAL     Patient location during evaluation: Mother Baby Anesthesia Type: Epidural Level of consciousness: awake and alert Pain management: pain level controlled Vital Signs Assessment: post-procedure vital signs reviewed and stable Respiratory status: spontaneous breathing, nonlabored ventilation and respiratory function stable Cardiovascular status: stable Postop Assessment: no headache, no backache, epidural receding, no apparent nausea or vomiting, patient able to bend at knees, adequate PO intake and able to ambulate Anesthetic complications: no    Last Vitals:  Vitals:   05/10/19 1517 05/10/19 1545  BP: 123/78 120/87  Pulse: 97 88  Resp: 18 18  Temp:  37.3 C  SpO2:  100%    Last Pain:  Vitals:   05/10/19 1545  TempSrc: Oral  PainSc: 2    Pain Goal:                   Lineth Thielke Hristova

## 2019-05-11 LAB — CBC
HCT: 27.5 % — ABNORMAL LOW (ref 36.0–46.0)
Hemoglobin: 9.3 g/dL — ABNORMAL LOW (ref 12.0–15.0)
MCH: 31.4 pg (ref 26.0–34.0)
MCHC: 33.8 g/dL (ref 30.0–36.0)
MCV: 92.9 fL (ref 80.0–100.0)
Platelets: 168 10*3/uL (ref 150–400)
RBC: 2.96 MIL/uL — ABNORMAL LOW (ref 3.87–5.11)
RDW: 12.8 % (ref 11.5–15.5)
WBC: 16.6 10*3/uL — ABNORMAL HIGH (ref 4.0–10.5)
nRBC: 0 % (ref 0.0–0.2)

## 2019-05-11 MED ORDER — IBUPROFEN 600 MG PO TABS: 600.0000 mg | ORAL_TABLET | Freq: Four times a day (QID) | ORAL | 0 refills | Status: DC

## 2019-05-11 MED ORDER — NORETHINDRONE 0.35 MG PO TABS: 1.0000 | ORAL_TABLET | Freq: Every day | ORAL | 11 refills | Status: DC

## 2019-05-11 MED ORDER — FERROUS SULFATE 325 (65 FE) MG PO TABS: 325.0000 mg | ORAL_TABLET | ORAL | 0 refills | Status: DC

## 2019-05-11 MED ORDER — FERROUS SULFATE 325 (65 FE) MG PO TABS
325.0000 mg | ORAL_TABLET | ORAL | Status: DC
  Administered 2019-05-11: 08:00:00 325 mg via ORAL
  Filled 2019-05-11: qty 1

## 2019-05-11 NOTE — Progress Notes (Signed)
Post Partum Day 1 Subjective: Patient reports feeling well. She is tolerating PO. Ambulating without difficulty. Lochia minimal. Foley removed overnight. Patient desires to discharge today if baby can discharge. Denies dizziness, lightheadedness.  Objective: Blood pressure 106/64, pulse (!) 113, temperature 98.6 F (37 C), temperature source Oral, resp. rate 16, height 5\' 2"  (1.575 m), weight 81.2 kg, last menstrual period 08/22/2018, SpO2 99 %, unknown if currently breastfeeding.  Physical Exam:  General: alert, cooperative and appears stated age Lochia: appropriate Uterine Fundus: firm Incision: NA DVT Evaluation: No evidence of DVT seen on physical exam.  Recent Labs    05/10/19 1811 05/11/19 0507  HGB 11.3* 9.3*  HCT 33.2* 27.5*    Assessment/Plan: Plan for discharge tomorrow; if baby can discharge today; okay to page team for discharge orders. PO iron initiated. Hgb 9.3 this morning after PPH. Received Methergine series. Lochia appropriate, fundus firm and patient asymptomatic.  Breastfeeding Desires POPs on discharge  Vitals stable    LOS: 2 days   05/13/19 05/11/2019, 6:55 AM

## 2019-05-11 NOTE — Discharge Instructions (Signed)
Parto vaginal, cuidados de puerperio Postpartum Care After Vaginal Delivery Lea esta informacin sobre cmo cuidarse desde el momento en que nazca su beb y hasta 6 a 12 semanas despus del parto (perodo del posparto). El mdico tambin podr darle instrucciones ms especficas. Comunquese con su mdico si tiene problemas o preguntas. Siga estas indicaciones en su casa: Hemorragia vaginal  Es normal tener un poco de hemorragia vaginal (loquios) despus del parto. Use un apsito sanitario para el sangrado vaginal y secrecin. ? Durante la primera semana despus del parto, la cantidad y el aspecto de los loquios a menudo es similar a las del perodo menstrual. ? Durante las siguientes semanas disminuir gradualmente hasta convertirse en una secrecin seca amarronada o amarillenta. ? En la mayora de las mujeres, los loquios se detienen completamente entre 4 a 6semanas despus del parto. Los sangrados vaginales pueden variar de mujer a mujer.  Cambie los apsitos sanitarios con frecuencia. Observe si hay cambios en el flujo, como: ? Un aumento repentino en el volumen. ? Cambio en el color. ? Cogulos sanguneos grandes.  Si expulsa un cogulo de sangre por la vagina, gurdelo y llame al mdico para informrselo. No deseche los cogulos de sangre por el inodoro antes de hablar con su mdico.  No use tampones ni se haga duchas vaginales hasta que el mdico la autorice.  Si no est amamantando, volver a tener su perodo entre 6 y 8 semanas despus del parto. Si solamente alimenta al beb con leche materna (lactancia materna exclusiva), podra no volver a tener su perodo hasta que deje de amamantar. Cuidados perineales  Mantenga la zona entre la vagina y el ano (perineo) limpia y seca, como se lo haya indicado el mdico. Utilice apsitos o aerosoles analgsicos y cremas, como se lo hayan indicado.  Si le hicieron un corte en el perineo (episiotoma) o tuvo un desgarro en la vagina, controle la  zona para detectar signos de infeccin hasta que sane. Est atenta a los siguientes signos: ? Aumento del enrojecimiento, la hinchazn o el dolor. ? Presenta lquido o sangre que supura del corte o desgarro. ? Calor. ? Pus o mal olor.  Es posible que le den una botella rociadora para que use en lugar de limpiarse el rea con papel higinico despus de usar el bao. Cuando comience a sanar, podr usar la botella rociadora antes de secarse. Asegrese de secarse suavemente.  Para aliviar el dolor causado por una episiotoma, un desgarro en la vagina o venas hinchadas en el ano (hemorroides), trate de tomar un bao de asiento tibio 2 o 3 veces por da. Un bao de asiento es un bao de agua tibia que se toma mientras se est sentado. El agua solo debe llegar hasta las caderas y cubrir las nalgas. Cuidado de las mamas  En los primeros das despus del parto, las mamas pueden sentirse pesadas, llenas e incmodas (congestin mamaria). Tambin puede escaparse leche de sus senos. El mdico puede sugerirle mtodos para aliviar este malestar. La congestin mamaria debera desaparecer al cabo de unos das.  Si est amamantando: ? Use un sostn que sujete y ajuste bien sus pechos. ? Mantenga los pezones secos y limpios. Aplquese cremas y ungentos, como se lo haya indicado el mdico. ? Es posible que deba usar discos de algodn en el sostn para absorber la leche que se filtre de sus senos. ? Puede tener contracciones uterinas cada vez que amamante durante varias semanas despus del parto. Las contracciones uterinas ayudan al tero a   regresar a su tamao habitual. ? Si tiene algn problema con la lactancia materna, colabore con el mdico o un asesor en lactancia.  Si no est amamantando: ? Evite tocarse mucho las mamas. Al hacerlo, podran producir ms leche. ? Use un sostn que le proporcione el ajuste correcto y compresas fras para reducir la hinchazn. ? No extraiga (saque) leche materna. Esto har que  produzca ms leche. Intimidad y sexualidad  Pregntele al mdico cundo puede retomar la actividad sexual. Esto puede depender de lo siguiente: ? Su riesgo de sufrir infecciones. ? La rapidez con la que est sanando. ? Su comodidad y deseo de retomar la actividad sexual.  Despus del parto, puede quedar embarazada incluso si no ha tenido todava su perodo. Si lo desea, hable con el mdico acerca de los mtodos de control de la natalidad (mtodos anticonceptivos). Medicamentos  Tome los medicamentos de venta libre y los recetados solamente como se lo haya indicado el mdico.  Si le recetaron un antibitico, tmelo como se lo haya indicado el mdico. No deje de tomar el antibitico aunque comience a sentirse mejor. Actividad  Retome sus actividades normales de a poco como se lo haya indicado el mdico. Pregntele al mdico qu actividades son seguras para usted.  Descanse todo lo que pueda. Trate de descansar o tomar una siesta mientras el beb duerme. Comida y bebida   Beba suficiente lquido como para mantener la orina de color amarillo plido.  Coma alimentos ricos en fibras todos los das. Estos pueden ayudarla a prevenir o aliviar el estreimiento. Los alimentos ricos en fibras incluyen, entre otros: ? Panes y cereales integrales. ? Arroz integral. ? Frijoles. ? Frutas y verduras frescas.  No intente perder de peso rpidamente reduciendo el consumo de caloras.  Tome sus vitaminas prenatales hasta la visita de seguimiento de posparto o hasta que su mdico le indique que puede dejar de tomarlas. Estilo de vida  No consuma ningn producto que contenga nicotina o tabaco, como cigarrillos y cigarrillos electrnicos. Si necesita ayuda para dejar de fumar, consulte al mdico.  No beba alcohol, especialmente si est amamantando. Instrucciones generales  Concurra a todas las visitas de seguimiento para usted y el beb, como se lo haya indicado el mdico. La mayora de las mujeres  visita al mdico para un seguimiento de posparto dentro de las primeras 3 a 6 semanas despus del parto. Comunquese con un mdico si:  Se siente incapaz de controlar los cambios que implica tener un hijo y esos sentimientos no desaparecen.  Siente tristeza o preocupacin de forma inusual.  Las mamas se ponen rojas, le duelen o se endurecen.  Tiene fiebre.  Tiene dificultad para retener la orina o para impedir que la orina se escape.  Tiene poco inters o falta de inters en actividades que solan gustarle.  No ha amamantado nada y no ha tenido un perodo menstrual durante 12 semanas despus del parto.  Dej de amamantar al beb y no ha tenido su perodo menstrual durante 12 semanas despus de dejar de amamantar.  Tiene preguntas sobre su cuidado y el del beb.  Elimina un cogulo de sangre grande por la vagina. Solicite ayuda de inmediato si:  Siente dolor en el pecho.  Tiene dificultad para respirar.  Tiene un dolor repentino e intenso en la pierna.  Tiene dolor intenso o clicos en el la parte inferior del abdomen.  Tiene una hemorragia tan intensa de la vagina que empapa ms de un apsito en una hora. El sangrado   no debe ser ms abundante que el perodo ms intenso que haya tenido.  Dolor de cabeza intenso.  Se desmaya.  Tiene visin borrosa o manchas en la vista.  Tiene secrecin vaginal con mal olor.  Tiene pensamientos acerca de lastimarse a usted misma o a su beb. Si alguna vez siente que puede lastimarse a usted misma o a otras personas, o tiene pensamientos de poner fin a su vida, busque ayuda de inmediato. Puede dirigirse al departamento de emergencias ms cercano o llamar a:  El servicio de emergencias de su localidad (911 en [place]  Una lnea de asistencia al suicida y atencin en crisis, como la Lnea Nacional de Prevencin del Suicidio (National Suicide Prevention Lifeline), al 1-800-273-8255. Est disponible las 24 horas del da. Resumen  El  perodo de tiempo justo despus el parto y hasta 6 a 12 semanas despus del parto se denomina perodo posparto.  Retome sus actividades normales de a poco como se lo haya indicado el mdico.  Concurra a todas las visitas de seguimiento para usted y el beb, como se lo haya indicado el mdico. Esta informacin no tiene como fin reemplazar el consejo del mdico. Asegrese de hacerle al mdico cualquier pregunta que tenga. Document Revised: 06/10/2017 Document Reviewed: 02/19/2017 Elsevier Patient Education  2020 Elsevier Inc.  

## 2019-05-11 NOTE — Lactation Note (Signed)
This note was copied from a baby's chart. Lactation Consultation Note  Patient Name: Judith Collins [id number] Date: 05/11/2019 Reason for consult: Follow-up assessment;Primapara;1st time breastfeeding;Early term 37-38.6wks  LC in to visit with P1 Mom of ET infant on day of discharge.  Baby 27 hrs old and at 3% weight loss.  1 void and 1 stool first 24 hrs.  Hand pump given with instructions on use and care.  Baby has been fed formula by bottle (10-20 ml) and has been latching to the breast.  Mom states baby is sleepy on the breast.  DEBP set up at bedside, but Mom not pumping as she doesn't like it.  Explained to Mom via intrepreter, that she needed to stimulate her milk supply with a double pump if baby was getting a lot of formula.  Talked about engorgement prevention and treatment.  Mom has WIC, explained about a [facility] loaner pump, but Mom declined saying she only wanted a hand pump.   Mom states (with help from interpreter), she wants to breast feed but wants baby to take formula also because he is sleepy and she doesn't have any milk yet.  (Set up for Engorgement)  Reviewed breast massage and hand expression.  Colostrum easily expressed into spoon.  Baby took 2 ml by spoon.    Baby dressed and swaddled in crib. Baby spitting old formula up.  Un-swaddled and unwrapped baby to awaken baby, instructing Mom of importance of baby being STS when feeding.  Baby woke up and placed STS on Mom's chest.  Mom using cradle hold, pinching her nipple to latch baby.  Spent time instructing Mom on importance of baby latching deeply to breast, and guided Mom's hands to support her breast more at base of breast.  Mom continually moved her fingers onto areola trying to push nipple into baby's mouth.     Mom trying to formula feed baby but he is too sleepy.  Offered to help with latching baby to right breast.  Mom has small breasts that are firm but compressible and nipples are erect.  Positioned baby in  football hold, and then switched to cross cradle.  Mom attempting to do cradle and pushing her nipple into baby's closed mouth.  Repeatedly instructed Mom to move her hand to base of breast, tease baby with nipple to top lip and wait for baby to open his mouth wide.  No latch obtained.   Explained how a lot of formula, without pumping her breasts, will lead to engorgement.  Engorgement treatment discussed.    Mom will keep baby STS as much as possible, watching for feeding cues, but NOT allowing baby to go past 3 hrs for a feeding.   Encouraged Mom to pump her breasts 10 min per side with hand pump if baby isn't latching to the breast.  Baby going to [facility] on Monday, March 1st.  Message sent to Soyla Dryer to see this baby and Mom.   Mom aware of OP lactation support.  Encouraged to call prn.  Interventions Interventions: Breast feeding basics reviewed;Assisted with latch;Skin to skin;Breast massage;Hand express;Breast compression;Adjust position;Support pillows;Position options;Expressed milk;Hand pump  Lactation Tools Discussed/Used Tools: Pump;Bottle Breast pump type: Double-Electric Breast Pump;Manual WIC Program: Yes Pump Review: Setup, frequency, and cleaning;Milk Storage Initiated by:: Erby Pian RN IBCLC Date initiated:: 05/11/19   Consult Status Consult Status: Complete Date: 05/11/19 Follow-up type: Call as needed    Judith Collins 05/11/2019, 4:21 PM

## 2019-05-16 ENCOUNTER — Encounter (HOSPITAL_COMMUNITY): Payer: Self-pay | Admitting: Obstetrics and Gynecology

## 2020-02-27 LAB — OB RESULTS CONSOLE RPR: RPR: NONREACTIVE

## 2020-02-27 LAB — OB RESULTS CONSOLE HIV ANTIBODY (ROUTINE TESTING): HIV: NONREACTIVE

## 2020-02-27 LAB — OB RESULTS CONSOLE RUBELLA ANTIBODY, IGM: Rubella: IMMUNE

## 2020-02-27 LAB — OB RESULTS CONSOLE GC/CHLAMYDIA
Chlamydia: NEGATIVE
Gonorrhea: NEGATIVE

## 2020-02-27 LAB — HEPATITIS C ANTIBODY: HCV Ab: NEGATIVE

## 2020-03-12 ENCOUNTER — Other Ambulatory Visit: Payer: Self-pay | Admitting: Family Medicine

## 2020-03-12 DIAGNOSIS — Z362 Encounter for other antenatal screening follow-up: Secondary | ICD-10-CM

## 2020-03-14 NOTE — L&D Delivery Note (Addendum)
Delivery Note Edwardine Deschepper Rodas Roselle Locus is a 25 y.o. G2P1001 s/p vaginal delivery at [redacted]w[redacted]d.  She was admitted for labor in the setting of PPROM 3/2 @1000 .    ROM: 23h 36m with clear fluid GBS Status: unknown, adequately treatment PCN Maximum Maternal Temperature: 98.10F   Labor Progress: Pt in latent labor on admission after PPROM at 10am on 3/2. She received one dose of BMZ and pitocin x3 hrs. It was then decided to manage expectantly without further augmentation. She was noted to have complete cervical dilation and then delivered as noted below.  Delivery Date/Time: 05/14/2020 at 0950. Delivery: Called to room and patient was complete and pushing. Head delivered LOA with compound right hand. No nuchal cord present. Shoulder and body delivered in usual fashion. Infant with spontaneous cry, placed on mother's abdomen, dried and stimulated. Cord clamped x 2 after 1-minute delay, and cut under my direct supervision. Placenta delivered spontaneously with gentle cord traction. Likely placental abruption noted. Uterine atony initially noted but became firm with cytotec, txa, methergine, uterine massage, pitocin. Labia, perineum, vagina, and cervix were inspected; no lacerations visualized. Dr. 07/14/2020 called to bedside to evaluate for possible Jada placement, upon evaluation decision made to place [place] without difficulty.  Placenta: likely abruption, 3-vessel cord, spontaneous, sent to pathology Complications: placental abruption Lacerations: none EBL: 900 ml Analgesia: epidural  Infant: female  APGARs 9 & 9  weight per medical record   [place], MD   GME ATTESTATION:  I saw and evaluated the patient. I agree with the findings and the plan of care as documented in the resident's note. I was gowned and gloved for the entirety of the delivery.  Maury Dus, MD OB Fellow, Faculty [facility], Center for [facility] 05/14/2020 12:39 PM   Jada placed by Dr. 07/14/2020  without difficulty.  Cervical balloon filled with 60cc.  Minimal blood noted in the tubing.  Plan for close observation.   Attestation of Attending Supervision of Obstetric Fellow: Evaluation and management procedures were performed by the Obstetric Fellow under my supervision and collaboration.  I have reviewed the Obstetric Fellow's note and chart, and I agree with the management and plan. I have also made any necessary editorial changes.   Germaine Pomfret, DO Attending Obstetrician & Gynecologist, [facility] for [facility] 05/14/2020 2:19 PM

## 2020-03-23 ENCOUNTER — Encounter: Payer: Self-pay | Admitting: *Deleted

## 2020-03-25 ENCOUNTER — Ambulatory Visit: Payer: Self-pay | Attending: Family Medicine

## 2020-03-25 ENCOUNTER — Ambulatory Visit: Payer: Self-pay | Admitting: *Deleted

## 2020-03-25 ENCOUNTER — Other Ambulatory Visit: Payer: Self-pay

## 2020-03-25 ENCOUNTER — Encounter: Payer: Self-pay | Admitting: *Deleted

## 2020-03-25 VITALS — BP 106/60 | HR 82

## 2020-03-25 DIAGNOSIS — O0930 Supervision of pregnancy with insufficient antenatal care, unspecified trimester: Secondary | ICD-10-CM

## 2020-03-25 DIAGNOSIS — Z362 Encounter for other antenatal screening follow-up: Secondary | ICD-10-CM | POA: Insufficient documentation

## 2020-05-13 ENCOUNTER — Encounter (HOSPITAL_COMMUNITY): Payer: Self-pay | Admitting: Student

## 2020-05-13 ENCOUNTER — Other Ambulatory Visit: Payer: Self-pay

## 2020-05-13 ENCOUNTER — Encounter: Payer: Self-pay | Admitting: Family Medicine

## 2020-05-13 ENCOUNTER — Inpatient Hospital Stay (HOSPITAL_COMMUNITY): Payer: Medicaid Other | Admitting: Anesthesiology

## 2020-05-13 ENCOUNTER — Inpatient Hospital Stay (HOSPITAL_COMMUNITY)
Admission: AD | Admit: 2020-05-13 | Discharge: 2020-05-16 | DRG: 805 | Disposition: A | Discharge status: Home / Self Care | Payer: Medicaid Other | Attending: Obstetrics & Gynecology | Admitting: Obstetrics & Gynecology

## 2020-05-13 DIAGNOSIS — Z20822 Contact with and (suspected) exposure to covid-19: Secondary | ICD-10-CM | POA: Diagnosis present

## 2020-05-13 DIAGNOSIS — Z3A34 34 weeks gestation of pregnancy: Secondary | ICD-10-CM

## 2020-05-13 DIAGNOSIS — O4593 Premature separation of placenta, unspecified, third trimester: Secondary | ICD-10-CM | POA: Diagnosis present

## 2020-05-13 DIAGNOSIS — O42919 Preterm premature rupture of membranes, unspecified as to length of time between rupture and onset of labor, unspecified trimester: Secondary | ICD-10-CM | POA: Diagnosis present

## 2020-05-13 DIAGNOSIS — O42913 Preterm premature rupture of membranes, unspecified as to length of time between rupture and onset of labor, third trimester: Principal | ICD-10-CM | POA: Diagnosis present

## 2020-05-13 LAB — CBC
HCT: 38.3 % (ref 36.0–46.0)
Hemoglobin: 13.5 g/dL (ref 12.0–15.0)
MCH: 31.8 pg (ref 26.0–34.0)
MCHC: 35.2 g/dL (ref 30.0–36.0)
MCV: 90.3 fL (ref 80.0–100.0)
Platelets: 255 10*3/uL (ref 150–400)
RBC: 4.24 MIL/uL (ref 3.87–5.11)
RDW: 12.7 % (ref 11.5–15.5)
WBC: 11 10*3/uL — ABNORMAL HIGH (ref 4.0–10.5)
nRBC: 0 % (ref 0.0–0.2)

## 2020-05-13 LAB — RESP PANEL BY RT-PCR (FLU A&B, COVID) ARPGX2
Influenza A by PCR: NEGATIVE
Influenza B by PCR: NEGATIVE
SARS Coronavirus 2 by RT PCR: NEGATIVE

## 2020-05-13 LAB — GROUP B STREP BY PCR: Group B strep by PCR: NEGATIVE

## 2020-05-13 LAB — TYPE AND SCREEN
ABO/RH(D): B POS
Antibody Screen: NEGATIVE

## 2020-05-13 MED ORDER — EPHEDRINE 5 MG/ML INJ: 10.0000 mg | INTRAVENOUS | Status: DC | PRN

## 2020-05-13 MED ORDER — DIPHENHYDRAMINE HCL 50 MG/ML IJ SOLN
12.5000 mg | INTRAMUSCULAR | Status: DC | PRN
Start: 2020-05-13 — End: 2020-05-14

## 2020-05-13 MED ORDER — PHENYLEPHRINE 40 MCG/ML (10ML) SYRINGE FOR IV PUSH (FOR BLOOD PRESSURE SUPPORT)
80.0000 ug | PREFILLED_SYRINGE | INTRAVENOUS | Status: DC | PRN
  Filled 2020-05-13: qty 10

## 2020-05-13 MED ORDER — TERBUTALINE SULFATE 1 MG/ML IJ SOLN: 0.2500 mg | Freq: Once | INTRAMUSCULAR | Status: DC | PRN

## 2020-05-13 MED ORDER — OXYTOCIN-SODIUM CHLORIDE 30-0.9 UT/500ML-% IV SOLN
1.0000 m[IU]/min | INTRAVENOUS | Status: DC
  Administered 2020-05-13 – 2020-05-14 (×2): 2 m[IU]/min via INTRAVENOUS
  Filled 2020-05-13: qty 500

## 2020-05-13 MED ORDER — LACTATED RINGERS IV SOLN
500.0000 mL | Freq: Once | INTRAVENOUS | Status: AC
  Administered 2020-05-13 – 2020-05-14 (×2): 500 mL via INTRAVENOUS

## 2020-05-13 MED ORDER — OXYTOCIN BOLUS FROM INFUSION
333.0000 mL | Freq: Once | INTRAVENOUS | Status: AC
  Administered 2020-05-14: 333 mL via INTRAVENOUS

## 2020-05-13 MED ORDER — LIDOCAINE HCL (PF) 1 % IJ SOLN
INTRAMUSCULAR | Status: DC | PRN
  Administered 2020-05-13: 7 mL via EPIDURAL
  Administered 2020-05-13: 3 mL via EPIDURAL

## 2020-05-13 MED ORDER — BETAMETHASONE SOD PHOS & ACET 6 (3-3) MG/ML IJ SUSP
12.0000 mg | INTRAMUSCULAR | Status: DC
  Administered 2020-05-13: 12 mg via INTRAMUSCULAR
  Filled 2020-05-13: qty 5
  Filled 2020-05-13: qty 2

## 2020-05-13 MED ORDER — ACETAMINOPHEN 325 MG PO TABS: 650.0000 mg | ORAL_TABLET | ORAL | Status: DC | PRN

## 2020-05-13 MED ORDER — LIDOCAINE HCL (PF) 1 % IJ SOLN: 30.0000 mL | INTRAMUSCULAR | Status: DC | PRN

## 2020-05-13 MED ORDER — SODIUM CHLORIDE 0.9 % IV SOLN
5.0000 10*6.[IU] | Freq: Once | INTRAVENOUS | Status: AC
  Administered 2020-05-13: 5 10*6.[IU] via INTRAVENOUS
  Filled 2020-05-13: qty 5

## 2020-05-13 MED ORDER — FENTANYL-BUPIVACAINE-NACL 0.5-0.125-0.9 MG/250ML-% EP SOLN
12.0000 mL/h | EPIDURAL | Status: DC | PRN
Start: 2020-05-13 — End: 2020-05-14
  Administered 2020-05-13: 12 mL/h via EPIDURAL
  Filled 2020-05-13 (×2): qty 250

## 2020-05-13 MED ORDER — OXYTOCIN-SODIUM CHLORIDE 30-0.9 UT/500ML-% IV SOLN: 2.5000 [IU]/h | INTRAVENOUS | Status: DC

## 2020-05-13 MED ORDER — LACTATED RINGERS IV SOLN
500.0000 mL | INTRAVENOUS | Status: DC | PRN
  Administered 2020-05-13: 500 mL via INTRAVENOUS

## 2020-05-13 MED ORDER — PHENYLEPHRINE 40 MCG/ML (10ML) SYRINGE FOR IV PUSH (FOR BLOOD PRESSURE SUPPORT): 80.0000 ug | PREFILLED_SYRINGE | INTRAVENOUS | Status: DC | PRN

## 2020-05-13 MED ORDER — OXYCODONE-ACETAMINOPHEN 5-325 MG PO TABS: 2.0000 | ORAL_TABLET | ORAL | Status: DC | PRN

## 2020-05-13 MED ORDER — SOD CITRATE-CITRIC ACID 500-334 MG/5ML PO SOLN: 30.0000 mL | ORAL | Status: DC | PRN

## 2020-05-13 MED ORDER — PENICILLIN G POT IN DEXTROSE 60000 UNIT/ML IV SOLN
3.0000 10*6.[IU] | INTRAVENOUS | Status: DC
  Administered 2020-05-13 – 2020-05-14 (×3): 3 10*6.[IU] via INTRAVENOUS
  Filled 2020-05-13 (×3): qty 50

## 2020-05-13 MED ORDER — ONDANSETRON HCL 4 MG/2ML IJ SOLN
4.0000 mg | Freq: Four times a day (QID) | INTRAMUSCULAR | Status: DC | PRN
  Administered 2020-05-14: 4 mg via INTRAVENOUS
  Filled 2020-05-13: qty 2

## 2020-05-13 MED ORDER — OXYCODONE-ACETAMINOPHEN 5-325 MG PO TABS: 1.0000 | ORAL_TABLET | ORAL | Status: DC | PRN

## 2020-05-13 MED ORDER — LACTATED RINGERS IV SOLN: INTRAVENOUS | Status: DC

## 2020-05-13 NOTE — Anesthesia Preprocedure Evaluation (Signed)
Anesthesia Evaluation  Patient identified by MRN, date of birth, ID band Patient awake    Reviewed: Allergy & Precautions, H&P , NPO status , Patient's Chart, lab work & pertinent test results  History of Anesthesia Complications Negative for: history of anesthetic complications  Airway Mallampati: II  TM Distance: >3 FB Neck ROM: full    Dental no notable dental hx.    Pulmonary asthma (URI-induced) ,    Pulmonary exam normal        Cardiovascular negative cardio ROS Normal cardiovascular exam Rhythm:regular Rate:Normal     Neuro/Psych negative neurological ROS  negative psych ROS   GI/Hepatic negative GI ROS, Neg liver ROS,   Endo/Other  negative endocrine ROS  Renal/GU negative Renal ROS  negative genitourinary   Musculoskeletal   Abdominal   Peds  Hematology negative hematology ROS (+)   Anesthesia Other Findings   Reproductive/Obstetrics (+) Pregnancy                             Anesthesia Physical Anesthesia Plan  ASA: II  Anesthesia Plan: Epidural   Post-op Pain Management:    Induction:   PONV Risk Score and Plan:   Airway Management Planned:   Additional Equipment:   Intra-op Plan:   Post-operative Plan:   Informed Consent: I have reviewed the patients History and Physical, chart, labs and discussed the procedure including the risks, benefits and alternatives for the proposed anesthesia with the patient or authorized representative who has indicated his/her understanding and acceptance.       Plan Discussed with:   Anesthesia Plan Comments:         Anesthesia Quick Evaluation

## 2020-05-13 NOTE — Progress Notes (Signed)
Called by [facility] NP   Judith Collins is a G2P1001 at [redacted]w[redacted]d who presented for routine [facility] and reported LOF at 10 AM. While at clinic has another gush of fluid. +grossly ruptured with fern pos 10 AM reported LOF  FHR 143 FH 34  [facility] collected GBS and CT/GT and will run stat.   Patient will be directly admitted for expectant management/IOL given PROM > 34 weeks.   Plan for BMZ and PCN  Federico Flake, MD, MPH, ABFM, [facility] Attending Physician Center for [facility]

## 2020-05-13 NOTE — H&P (Signed)
Kanna Dafoe Rodas Roselle Locus is a 25 y.o. female G2P1001 with IUP at [redacted]w[redacted]d by uncertain LMP; EDC changed to 06/18/2020 per 2nd trimester [place].   She is  presenting for SROM today at 10 am.  She reports positive fetal movement. She endorses clear leakage of fluid since 10 am. She denies vaginal bleeding.  Prenatal History/Complications: PNC at [facility]; late to care; had PPH with last baby; treated with hemabate, methergine, TXA.  Pregnancy complications:  - Past Medical History: Past Medical History:  Diagnosis Date  . Asthma   . Bronchitis     Past Surgical History: Past Surgical History:  Procedure Laterality Date  . NO PAST SURGERIES      Obstetrical History: OB History    Gravida  2   Para  1   Term  1   Preterm      AB      Living  1     SAB      IAB      Ectopic      Multiple      Live Births  1            Social History: Social History   Socioeconomic History  . Marital status: Single    Spouse name: Not on file  . Number of children: 1  . Years of education: Not on file  . Highest education level: Not on file  Occupational History  . Not on file  Tobacco Use  . Smoking status: Never Smoker  . Smokeless tobacco: Never Used  Vaping Use  . Vaping Use: Never used  Substance and Sexual Activity  . Alcohol use: No  . Drug use: No  . Sexual activity: Yes    Birth control/protection: None  Other Topics Concern  . Not on file  Social History Narrative   ** Merged History Encounter **       Social Determinants of Health   Financial Resource Strain: Not on file  Food Insecurity: Not on file  Transportation Needs: Not on file  Physical Activity: Not on file  Stress: Not on file  Social Connections: Not on file    Family History: Family History  Problem Relation Age of Onset  . Asthma Mother     Allergies: Allergies  Allergen Reactions  . Lactose Intolerance (Gi)     Medications Prior to Admission  Medication Sig Dispense  Refill Last Dose  . Prenatal Vit-Fe Fumarate-FA (PRENATAL VITAMIN PO) Take by mouth.   05/12/2020 at Unknown time  . ferrous sulfate 325 (65 FE) MG tablet Take 1 tablet (325 mg total) by mouth every other day. (Patient not taking: Reported on 03/25/2020) 16 tablet 0   . ibuprofen (ADVIL) 600 MG tablet Take 1 tablet (600 mg total) by mouth every 6 (six) hours. (Patient not taking: Reported on 03/25/2020) 30 tablet 0   . norethindrone (ORTHO MICRONOR) 0.35 MG tablet Take 1 tablet (0.35 mg total) by mouth daily. (Patient not taking: Reported on 03/25/2020) 1 Package 11   . polyethylene glycol powder (GLYCOLAX/MIRALAX) powder Take 17 g by mouth daily as needed. (Patient not taking: No sig reported) 255 g 0     Review of Systems   Constitutional: Negative for fever and chills Eyes: Negative for visual disturbances Respiratory: Negative for shortness of breath, dyspnea Cardiovascular: Negative for chest pain or palpitations  Gastrointestinal: Negative for vomiting, diarrhea and constipation.  POSITIVE for abdominal pain (contractions) Genitourinary: Negative for dysuria and urgency Musculoskeletal: Negative for back  pain, joint pain, myalgias  Neurological: Negative for dizziness and headaches  Blood pressure 103/64, pulse 90, resp. rate 16, height 5\' 5"  (1.651 m), weight 85.6 kg, last menstrual period 09/06/2019, unknown if currently breastfeeding. General appearance: alert, cooperative and no distress Lungs: normal respiratory effort Heart: regular rate and rhythm Abdomen: soft, non-tender; bowel sounds normal Extremities: Homans sign is negative, no sign of DVT DTR's 2+ Presentation: cephalic by 09/08/2019 Fetal monitoring  Baseline: 135 bpm, mod var, present acel, no decels,  Uterine activity  No contractions Dilation: 3 Effacement (%): 70 Station: -1 Exam by:: Charyl Minervini   Prenatal labs: ABO, Rh: --/--/B POS (03/02 1600) Antibody: NEG (03/02 1600) Rubella:  immune RPR:   NR HBsAg:    NR HIV:    NR GBS:   Unsure 1 hr Glucola passed at 109 Genetic screening  Not done Anatomy 01-20-1990 normal  Prenatal Transfer Tool  Maternal Diabetes: No Genetic Screening: Not done Maternal Ultrasounds/Referrals: Normal Fetal Ultrasounds or other Referrals:  None Maternal Substance Abuse:  No Significant Maternal Medications:  None Significant Maternal Lab Results: Other: GBS unknown  Results for orders placed or performed during the hospital encounter of 05/13/20 (from the past 24 hour(s))  CBC   Collection Time: 05/13/20  3:53 PM  Result Value Ref Range   WBC 11.0 (H) 4.0 - 10.5 K/uL   RBC 4.24 3.87 - 5.11 MIL/uL   Hemoglobin 13.5 12.0 - 15.0 g/dL   HCT 07/13/20 24.2 - 35.3 %   MCV 90.3 80.0 - 100.0 fL   MCH 31.8 26.0 - 34.0 pg   MCHC 35.2 30.0 - 36.0 g/dL   RDW 61.4 43.1 - 54.0 %   Platelets 255 150 - 400 K/uL   nRBC 0.0 0.0 - 0.2 %  Type and screen [facility]   Collection Time: 05/13/20  4:00 PM  Result Value Ref Range   ABO/RH(D) B POS    Antibody Screen NEG    Sample Expiration      05/16/2020,2359 Performed at [facility], [address], [place], [place]     Assessment: Aniya Jolicoeur is a 25 y.o. G2P1001 with an IUP at [redacted]w[redacted]d presenting for PPROM at 10am. Patient's cervix is favorable; will start pitocin.  -will give one dose of BMZ -will treat with PCN  Plan: #Labor: expectant management #Pain:  Per request #FWB Cat 1 #ID: GBS: unknown, will treat  #MOF:  breast #MOC: condoms #Circ: No   [redacted]w[redacted]d Addison Freimuth 05/13/2020, 5:50 PM

## 2020-05-13 NOTE — Progress Notes (Signed)
Labor Progress Note Judith Collins is a 25 y.o. G2P1001 at [redacted]w[redacted]d presented for PPROM at 1000 in clinic today.  S:  Pt resting comfortably with epidural in place.   O:  BP 114/72   Pulse 84   Temp 97.7 F (36.5 C) (Oral)   Resp 17   Ht 5\' 5"  (1.651 m)   Wt 85.6 kg   LMP 09/06/2019   BMI 31.42 kg/m  EFM: baseline 130/moderate variability/+accels/no decels Toco: contractions q2-4 min  CVE: Dilation: 3 Effacement (%): 70 Station: -1 Exam by:: Kooistra   A&P: 25 y.o. G2P1001 [redacted]w[redacted]d presented for PPROM at 1000 in clinic today. #Labor s/p PPROM: S/p BMZ x1 at 1717 today. Will continue to manage expectantly overnight given preterm. Will plan for NICU call at time of delivery given GA. #Pain: epidural in place #FWB: Category 1 strip #GBS unknown; PCN started on admission  #H/o PPH: plan for TXA at time of delivery  [redacted]w[redacted]d, MD 8:37 PM

## 2020-05-13 NOTE — Anesthesia Procedure Notes (Signed)
Epidural Patient location during procedure: OB Start time: 05/13/2020 8:02 PM End time: 05/13/2020 8:15 PM  Staffing Anesthesiologist: Lucretia Kern, MD Performed: anesthesiologist   Preanesthetic Checklist Completed: patient identified, IV checked, risks and benefits discussed, monitors and equipment checked, pre-op evaluation and timeout performed  Epidural Patient position: sitting Prep: DuraPrep Patient monitoring: heart rate, continuous pulse ox and blood pressure Approach: midline Location: L3-L4 Injection technique: LOR air  Needle:  Needle type: Tuohy  Needle gauge: 17 G Needle length: 9 cm Needle insertion depth: 5 cm Catheter type: closed end flexible Catheter size: 19 Gauge Catheter at skin depth: 10 cm Test dose: negative  Assessment Events: blood not aspirated, injection not painful, no injection resistance, no paresthesia and negative IV test  Additional Notes Reason for block:procedure for pain

## 2020-05-14 ENCOUNTER — Encounter (HOSPITAL_COMMUNITY): Payer: Self-pay | Admitting: Student

## 2020-05-14 DIAGNOSIS — Z3A35 35 weeks gestation of pregnancy: Secondary | ICD-10-CM

## 2020-05-14 DIAGNOSIS — O42013 Preterm premature rupture of membranes, onset of labor within 24 hours of rupture, third trimester: Secondary | ICD-10-CM

## 2020-05-14 DIAGNOSIS — O4593 Premature separation of placenta, unspecified, third trimester: Secondary | ICD-10-CM

## 2020-05-14 DIAGNOSIS — O42919 Preterm premature rupture of membranes, unspecified as to length of time between rupture and onset of labor, unspecified trimester: Secondary | ICD-10-CM | POA: Diagnosis present

## 2020-05-14 LAB — RPR: RPR Ser Ql: NONREACTIVE

## 2020-05-14 MED ORDER — ACETAMINOPHEN 500 MG PO TABS
1000.0000 mg | ORAL_TABLET | Freq: Four times a day (QID) | ORAL | Status: DC
  Administered 2020-05-14 – 2020-05-16 (×5): 1000 mg via ORAL
  Filled 2020-05-14 (×6): qty 2

## 2020-05-14 MED ORDER — SENNOSIDES-DOCUSATE SODIUM 8.6-50 MG PO TABS
2.0000 | ORAL_TABLET | Freq: Every day | ORAL | Status: DC
  Administered 2020-05-15 – 2020-05-16 (×2): 2 via ORAL
  Filled 2020-05-14 (×2): qty 2

## 2020-05-14 MED ORDER — METHYLERGONOVINE MALEATE 0.2 MG PO TABS: 0.2000 mg | ORAL_TABLET | ORAL | Status: DC | PRN

## 2020-05-14 MED ORDER — MISOPROSTOL 200 MCG PO TABS
1000.0000 ug | ORAL_TABLET | Freq: Once | ORAL | Status: AC
  Administered 2020-05-14: 1000 ug via RECTAL

## 2020-05-14 MED ORDER — TETANUS-DIPHTH-ACELL PERTUSSIS 5-2.5-18.5 LF-MCG/0.5 IM SUSY: 0.5000 mL | PREFILLED_SYRINGE | Freq: Once | INTRAMUSCULAR | Status: DC

## 2020-05-14 MED ORDER — SIMETHICONE 80 MG PO CHEW: 80.0000 mg | CHEWABLE_TABLET | ORAL | Status: DC | PRN

## 2020-05-14 MED ORDER — FERROUS SULFATE 325 (65 FE) MG PO TABS: 325.0000 mg | ORAL_TABLET | ORAL | Status: DC

## 2020-05-14 MED ORDER — ONDANSETRON HCL 4 MG PO TABS: 4.0000 mg | ORAL_TABLET | ORAL | Status: DC | PRN

## 2020-05-14 MED ORDER — COCONUT OIL OIL: 1.0000 "application " | TOPICAL_OIL | Status: DC | PRN

## 2020-05-14 MED ORDER — ONDANSETRON HCL 4 MG/2ML IJ SOLN: 4.0000 mg | INTRAMUSCULAR | Status: DC | PRN

## 2020-05-14 MED ORDER — MEDROXYPROGESTERONE ACETATE 150 MG/ML IM SUSP: 150.0000 mg | INTRAMUSCULAR | Status: DC | PRN

## 2020-05-14 MED ORDER — PRENATAL MULTIVITAMIN CH
1.0000 | ORAL_TABLET | Freq: Every day | ORAL | Status: DC
  Administered 2020-05-15 – 2020-05-16 (×2): 1 via ORAL
  Filled 2020-05-14 (×2): qty 1

## 2020-05-14 MED ORDER — BENZOCAINE-MENTHOL 20-0.5 % EX AERO
1.0000 "application " | INHALATION_SPRAY | CUTANEOUS | Status: DC | PRN
  Administered 2020-05-14: 1 via TOPICAL
  Filled 2020-05-14: qty 56

## 2020-05-14 MED ORDER — METHYLERGONOVINE MALEATE 0.2 MG/ML IJ SOLN
0.2000 mg | INTRAMUSCULAR | Status: DC | PRN
  Administered 2020-05-14: 0.2 mg via INTRAMUSCULAR

## 2020-05-14 MED ORDER — ZOLPIDEM TARTRATE 5 MG PO TABS: 5.0000 mg | ORAL_TABLET | Freq: Every evening | ORAL | Status: DC | PRN

## 2020-05-14 MED ORDER — METHYLERGONOVINE MALEATE 0.2 MG/ML IJ SOLN
INTRAMUSCULAR | Status: AC
  Filled 2020-05-14: qty 1

## 2020-05-14 MED ORDER — LACTATED RINGERS IV SOLN: INTRAVENOUS | Status: AC

## 2020-05-14 MED ORDER — WITCH HAZEL-GLYCERIN EX PADS: 1.0000 "application " | MEDICATED_PAD | CUTANEOUS | Status: DC | PRN

## 2020-05-14 MED ORDER — DIBUCAINE (PERIANAL) 1 % EX OINT: 1.0000 "application " | TOPICAL_OINTMENT | CUTANEOUS | Status: DC | PRN

## 2020-05-14 MED ORDER — MISOPROSTOL 200 MCG PO TABS
ORAL_TABLET | ORAL | Status: AC
  Filled 2020-05-14: qty 5

## 2020-05-14 MED ORDER — TRANEXAMIC ACID-NACL 1000-0.7 MG/100ML-% IV SOLN
INTRAVENOUS | Status: AC
  Administered 2020-05-14: 1000 mg
  Filled 2020-05-14: qty 100

## 2020-05-14 MED ORDER — IBUPROFEN 600 MG PO TABS
600.0000 mg | ORAL_TABLET | Freq: Four times a day (QID) | ORAL | Status: DC
  Administered 2020-05-14 – 2020-05-16 (×5): 600 mg via ORAL
  Filled 2020-05-14 (×7): qty 1

## 2020-05-14 MED ORDER — DIPHENHYDRAMINE HCL 25 MG PO CAPS: 25.0000 mg | ORAL_CAPSULE | Freq: Four times a day (QID) | ORAL | Status: DC | PRN

## 2020-05-14 NOTE — Lactation Note (Signed)
This note was copied from a baby's chart. Lactation Consultation Note  Patient Name: Judith Collins ZYSAY'T Date: 05/14/2020 Reason for consult: Initial assessment;Late-preterm 34-36.6wks Age:23 hours  Consult was done in Spanish:  Initial visit to 5 hours infant of a P2 with hx low milk supply. Mother states she tried breastfeeding her first child but infant did not want breast. Mother's feeding goal is to breastfeed. Mother also consents to supplement with donor milk as needed. Discussed "caring for your LPTI" green sheet.   Reviewed hand expression and able to collect ~8mL in colostrum vial. Set up DEBP for stimulation and supplementation. Provided education regarding frequency, cleaning and milk storage. Encouraged to pump every time she feeds infant and feed baby everything she pumps.    Reviewed breastfeeding basics. Reviewed NBN behavior and first days expectations with mother and encouraged to contact [facility] for support when ready to breastfeed baby and recommended to request help for questions or concerns.   All questions answered at this time.    Plan:   1-Breastfeeding on demand, ensuring a deep, comfortable latch.  2-Undressing infant and place skin to skin when ready to breastfeed 3-Keep infant awake during breastfeeding session: massaging breast, infant's hand/shoulder/feet 4-Monitor voids and stools as signs good intake.  5-Pump after breastfeeding for stimulation. 6-Supplement as needed following volume guideline according to age.  5-Contact LC as needed for feeds/support/concerns/questions  All questions answered at this time. Provided LC services brochure and promoted [employer] booklet.    Maternal Data Has patient been taught Hand Expression?: Yes Does the patient have breastfeeding experience prior to this delivery?: No  Feeding Mother's Current Feeding Choice: Breast Milk and Donor Milk Nipple Type: Extra Slow Flow  LATCH Score Latch: Grasps breast easily,  tongue down, lips flanged, rhythmical sucking.  Audible Swallowing: A few with stimulation  Type of Nipple: Everted at rest and after stimulation  Comfort (Breast/Nipple): Soft / non-tender  Hold (Positioning): Full assist, staff holds infant at breast  LATCH Score: 7   Lactation Tools Discussed/Used Tools: Pump;Flanges Flange Size: 21 Breast pump type: Double-Electric Breast Pump;Manual Pump Education: Setup, frequency, and cleaning;Milk Storage Reason for Pumping: LPTI protocol Pumping frequency: 8-12 times in 24h  Interventions Interventions: Breast feeding basics reviewed;Expressed milk;Breast massage;Hand express;Skin to skin;DEBP;Hand pump;Education  Discharge Pump: Personal WIC Program: Yes  Consult Status Consult Status: Follow-up Date: 05/15/20 Follow-up type: In-patient    Judith Collins 05/14/2020, 3:12 PM

## 2020-05-14 NOTE — Plan of Care (Signed)
Carlyn Reichert , education completed

## 2020-05-14 NOTE — Lactation Note (Addendum)
This note was copied from a baby's chart. Lactation Consultation Note  Patient Name: Judith Collins ZJIRC'V Date: 05/14/2020 Reason for consult: L&D Initial assessment;Late-preterm 34-36.6wks;1st time breastfeeding Age:23 hours   Video interpreter used for Spanish.  Mother's first time breastfeeding.  P2, Baby 35 weeks.  Mother with high EBL per RN attempting to latch upon entering. Baby opened wide and LC assisted with latching baby deep on breast.  Baby off and on latching. Informed mother we will get her pumping in addition to breastfeeding once in room on MBU. Mother feeling nauseous at this time.    Maternal Data Does the patient have breastfeeding experience prior to this delivery?: No    LATCH Score Latch: Repeated attempts needed to sustain latch, nipple held in mouth throughout feeding, stimulation needed to elicit sucking reflex.  Audible Swallowing: A few with stimulation  Type of Nipple: Everted at rest and after stimulation  Comfort (Breast/Nipple): Soft / non-tender  Hold (Positioning): Assistance needed to correctly position infant at breast and maintain latch.  LATCH Score: 7    Interventions Interventions: Breast feeding basics reviewed;Assisted with latch;Skin to skin;Breast compression;Adjust position;Education    Consult Status Consult Status: Follow-up Date: 05/14/20 Follow-up type: In-patient    Dahlia Byes [facility] 05/14/2020, 10:38 AM

## 2020-05-14 NOTE — Anesthesia Postprocedure Evaluation (Signed)
Anesthesia Post Note  Patient: Judith Collins  Procedure(s) Performed: AN AD HOC LABOR EPIDURAL     Patient location during evaluation: Mother Baby Anesthesia Type: Epidural Level of consciousness: awake and alert Pain management: pain level controlled Vital Signs Assessment: post-procedure vital signs reviewed and stable Respiratory status: spontaneous breathing, nonlabored ventilation and respiratory function stable Cardiovascular status: stable Postop Assessment: no headache, no backache, epidural receding, no apparent nausea or vomiting, patient able to bend at knees, adequate PO intake and able to ambulate Anesthetic complications: no   No complications documented.  Last Vitals:  Vitals:   05/14/20 1330 05/14/20 1425  BP: 109/67 109/69  Pulse: 96 93  Resp: 18 17  Temp: 37 C 36.8 C  SpO2: 99% 99%    Last Pain:  Vitals:   05/14/20 1425  TempSrc: Oral  PainSc:    Pain Goal: Patients Stated Pain Goal: 1 (05/13/20 1754)                 Laban Emperor

## 2020-05-14 NOTE — Discharge Summary (Signed)
Postpartum Discharge Summary     Patient Name: Judith Collins DOB: Nov 29, 1995 MRN: [id number]  Date of admission: 05/13/2020 Delivery date:05/14/2020  Delivering provider: Arrie Senate  Date of discharge: 05/16/2020  Admitting diagnosis: Indication for care in labor and delivery, antepartum [O75.9] Intrauterine pregnancy: [redacted]w[redacted]d    Secondary diagnosis:  Active Problems:   Indication for care in labor and delivery, antepartum   Preterm premature rupture of membranes (PPROM) delivered, current hospitalization   Preterm delivery  Additional problems: none    Discharge diagnosis: Term Pregnancy Delivered                                              Post partum procedures:none Augmentation: Pitocin Complications: None  Hospital course: Onset of Labor With Vaginal Delivery      25y.o. yo [id number] admitted in Latent Labor on 05/13/2020. Patient presented with PPROM 3/2 @1000  and was not making cervical change at that time. Decision at that time to initiate pitocin after BMZ x1 was administered. Pitocin x3 hours was administered and then decision was made to stop pitocin and manage patient expectantly to allow for second dose of BMZ. Patient ultimately progressed to complete and had an uncomplicated delivery, although she did have an EBL [id number] required cytotec, pitocin, methergine and a JadCorey Skainss placed with resolution of symptoms. Patient had an uncomplicated labor course as follows:  Membrane Rupture Time/Date: 10:00 AM ,05/13/2020   Delivery Method:Vaginal, Spontaneous  Episiotomy: None  Lacerations:  None  Patient had an uncomplicated postpartum course. Her hgb went from 13.5 to 8.7. She was offered IV venofer but declined. She was restarted on PO iron.  She is ambulating, tolerating a regular diet, passing flatus, and urinating well. Patient is discharged home in stable condition on 05/16/20.  Newborn Data: Birth date:05/14/2020  Birth time:9:50 AM   Gender:Female  Living status:Living  Apgars:9 ,9  Weight:2384 g   Magnesium Sulfate received: No BMZ received: Yes x1 Rhophylac:N/A MMR:N/A T-DaP:Given prenatally Flu: No Transfusion:No  Physical exam  Vitals:   05/15/20 0556 05/15/20 1412 05/15/20 2141 05/16/20 0520  BP: 105/63 (!) 102/58 107/65 113/70  Pulse: 76 88 78 75  Resp: 18 18 16 18   Temp: 98.3 F (36.8 C) 97.6 F (36.4 C) 98.4 F (36.9 C) 98.4 F (36.9 C)  TempSrc: Oral Oral Oral Oral  SpO2: 100% 100% 100% 100%  Weight:      Height:       General: alert, cooperative and no distress Lochia: appropriate Uterine Fundus: firm Incision: Healing well with no significant drainage DVT Evaluation: No evidence of DVT seen on physical exam. Labs: Lab Results  Component Value Date   WBC 15.8 (H) 05/15/2020   HGB 8.7 (L) 05/15/2020   HCT 25.8 (L) 05/15/2020   MCV 94.2 05/15/2020   PLT 180 05/15/2020   CMP Latest Ref Rng & Units 02/16/2015  Glucose 65 - 99 mg/dL 102(H)  BUN 6 - 20 mg/dL 13  Creatinine 0.50 - 1.00 mg/dL 0.63  Sodium 135 - 145 mmol/L 139  Potassium 3.5 - 5.1 mmol/L 4.4  Chloride 101 - 111 mmol/L 106  CO2 22 - 32 mmol/L 25  Calcium 8.9 - 10.3 mg/dL 9.8  Total Protein 6.5 - 8.1 g/dL 7.6  Total Bilirubin 0.3 - 1.2 mg/dL 0.4  Alkaline Phos 47 - 119 U/L  113  AST 15 - 41 U/L 18  ALT 14 - 54 U/L 11(L)   Flavia Shipper Score: Edinburgh Postnatal Depression Scale Screening Tool 05/15/2020  I have been able to laugh and see the funny side of things. 0  I have looked forward with enjoyment to things. 0  I have blamed myself unnecessarily when things went wrong. 1  I have been anxious or worried for no good reason. 0  I have felt scared or panicky for no good reason. 1  Things have been getting on top of me. 0  I have been so unhappy that I have had difficulty sleeping. 0  I have felt sad or miserable. 1  I have been so unhappy that I have been crying. 1  The thought of harming myself has occurred to me. 0   Edinburgh Postnatal Depression Scale Total 4     After visit meds:  Allergies as of 05/16/2020      Reactions   Lactose Intolerance (gi)       Medication List    STOP taking these medications   norethindrone 0.35 MG tablet Commonly known as: Ortho Micronor     TAKE these medications   acetaminophen 500 MG tablet Commonly known as: TYLENOL Take 2 tablets (1,000 mg total) by mouth every 6 (six) hours.   ferrous sulfate 325 (65 FE) MG tablet Take 1 tablet (325 mg total) by mouth every other day.   ibuprofen 600 MG tablet Commonly known as: ADVIL Take 1 tablet (600 mg total) by mouth every 6 (six) hours.   polyethylene glycol powder 17 GM/SCOOP powder Commonly known as: GLYCOLAX/MIRALAX Take 17 g by mouth daily as needed.   PRENATAL VITAMIN PO Take by mouth.        Discharge home in stable condition Infant Feeding: Breast Infant Disposition:home with mother Discharge instruction: per After Visit Summary and Postpartum booklet. Activity: Advance as tolerated. Pelvic rest for 6 weeks.  Diet: routine diet Future Appointments:No future appointments. Follow up Visit: Patient encouraged to follow up for postpartum visit at [facility] in 4-6 weeks.   Please schedule this patient for a In person postpartum visit in 6 weeks with the following provider: Any provider. Additional Postpartum F/U:none  Low risk pregnancy complicated by: postpartum hemorrhage, d/cd on PO iron  Delivery mode:  Vaginal, Spontaneous  Anticipated Birth Control:  Condoms   05/16/2020 Janet Berlin, MD

## 2020-05-14 NOTE — Discharge Instructions (Signed)
Parto vaginal, cuidados de puerperio Postpartum Care After Vaginal Delivery La siguiente informacin ofrece una gua sobre cmo cuidarse desde el momento en que nazca su beb y hasta 6 a 12 semanas despus del parto (perodo del posparto). Si tiene problemas o preguntas, pngase en contacto con su mdico para obtener instrucciones ms especficas. Siga estas instrucciones en su casa: Hemorragia vaginal  Es normal tener un poco de hemorragia vaginal (loquios) despus del parto. Use un apsito sanitario para el sangrado y secrecin. ? Durante la primera semana despus del parto, la cantidad y el aspecto de los loquios a menudo es similar a los del perodo menstrual. ? Durante las siguientes semanas disminuir gradualmente hasta convertirse en una secrecin seca amarronada o amarillenta. ? En la mayora de las mujeres, los loquios se detienen completamente entre 4 a 6semanas despus del parto, pero esto puede variar.  Cambie los apsitos sanitarios con frecuencia. Observe si hay cambios en el flujo, como: ? Aumento repentino en el volumen. ? Cambio en el color. ? Cogulos de sangre grandes.  Si expulsa un cogulo de sangre por la vagina, gurdelo y llame al mdico. No deseche los cogulos de sangre por el inodoro antes de hablar con su mdico.  No use tampones ni se haga duchas vaginales hasta que el mdico la autorice.  Si no est amamantando, volver a tener su perodo entre 6 y 8 semanas despus del parto. Si solamente alimenta al beb con leche materna, podra no volver a tener su perodo hasta que deje de amamantar. Cuidados perineales  Mantenga la zona entre la vagina y el ano (perineo) limpia y seca. Utilice apsitos o aerosoles analgsicos y cremas, como se lo hayan indicado.  Si le hicieron un corte quirrgico en el perineo (episiotoma) o tuvo un desgarro, controle la zona para detectar signos de infeccin hasta que sane. Est atenta a los siguientes signos: ? Aumento del  enrojecimiento, la hinchazn o el dolor. ? Presenta lquido o sangre que supura del corte o desgarro. ? Calor. ? Pus o mal olor.  Es posible que le den una botella rociadora para que use en lugar de limpiarse el rea con papel higinico despus de usar el bao. Seque la zona dando golpecitos suaves.  Para aliviar el dolor causado por una episiotoma, un desgarro o venas hinchadas en el ano (hemorroides), tome un bao de asiento tibio 2 o 3 veces por da. En un bao de asiento, el agua tibia solamente debe llegar hasta las caderas y cubrir las nalgas.   Cuidado de las mamas  En los primeros das despus del parto, las mamas pueden sentirse pesadas, llenas e incmodas (congestin mamaria). Tambin puede escaparse leche de sus senos. Pdale al mdico que le sugiera formas de aliviar el malestar.  Si est amamantando: ? Use un sostn que sujete las mamas y ajuste bien. Utilice protectores mamarios para absorber la leche que se filtre. ? Mantenga los pezones secos y limpios. Aplique cremas y ungentos como se lo hayan indicado. ? Puede tener contracciones uterinas cada vez que amamante durante varias semanas despus del parto. Esto ayuda a que el tero vuelva a su tamao normal. ? Si tiene algn problema con la lactancia materna, infrmelo al mdico o a un asesor en lactancia.  Si no est amamantando: ? Evite tocarse las mamas. No extraiga (saque) leche materna. Al hacerlo, podran producir ms leche. ? Use un sostn que le proporcione el ajuste correcto y compresas fras para reducir la hinchazn. Intimidad y sexualidad    Pregntele al mdico cundo puede retomar la actividad sexual. Esto puede depender de lo siguiente: ? Su riesgo de sufrir infecciones. ? La rapidez con la que est sanando. ? Su comodidad y deseo de retomar la actividad sexual.  Despus del parto, puede quedar embarazada incluso si no ha tenido todava su perodo. Hable con el mdico acerca de los mtodos de control de la  natalidad (mtodos anticonceptivos) o planificacin familiar si desea tener embarazos en el futuro. Medicamentos  Use los medicamentos de venta libre y los recetados solamente como se lo haya indicado el mdico.  Tome un laxante de venta libre para ayudar con las deposiciones como se lo haya indicado el mdico.  Si le recetaron un antibitico, tmelo como se lo haya indicado el mdico. No deje de tomar el antibitico aunque comience a sentirse mejor.  Revise todos los medicamentos con receta anteriores y actuales para comprobar la posible transferencia a la leche materna. Actividad  Retome sus actividades normales de a poco segn lo indicado por el mdico.  Descanse todo lo que pueda. Tome siestas mientras el beb duerme. Comida y bebida  Beba suficiente lquido como para mantener la orina de color amarillo plido.  Para ayudar a prevenir o aliviar el estreimiento, coma alimentos ricos en fibra todos los das.  Elija una alimentacin saludable para ayudar a la lactancia o los objetivos de prdida de peso.  Tome sus vitaminas prenatales hasta que su mdico le indique que deje de hacerlo.   Recomendaciones/consejos generales  No consuma ningn producto que contenga nicotina o tabaco. Estos productos incluyen cigarrillos, tabaco para mascar y aparatos de vapeo, como los cigarrillos electrnicos. Si necesita ayuda para dejar de fumar, consulte al mdico.  No beba alcohol, especialmente si est amamantando.  No tome medicamentos o frmacos que no se le receten, especialmente si est en perodo de lactancia.  Visite al mdico para un control de posparto dentro de las primeras 3 a 6 semanas despus del parto.  Realice una visita posparto integral a ms tardar 12 semanas despus del parto.  Asista a todas las visitas de seguimiento para usted y su beb. Comunquese con un mdico si:  Siente tristeza o preocupacin de forma inusual.  Las mamas se ponen rojas, le duelen o se  endurecen.  Tiene fiebre u otros signos de infeccin.  Tiene sangrado que est empapando una compresa por hora o tiene cogulos de sangre.  Siente un dolor de cabeza intenso que no se alivia o tiene cambios en la visin.  Tiene nuseas y vmitos y no puede comer o beber nada durante 24horas. Solicite ayuda de inmediato si:  Tiene dolor en el pecho o dificultad para respirar.  Tiene un dolor repentino e intenso en la pierna.  Tiene una convulsin o se desmaya.  Tiene pensamientos acerca de lastimarse a usted misma o a su beb. Si alguna vez siente que puede lastimarse o lastimar a otras personas, o tiene pensamientos de poner fin a su vida, busque ayuda de inmediato. Dirjase al servicio de urgencias ms cercano o:  Comunquese con el servicio de emergencias de su localidad (911 en los [place]).  National Suicide Prevention Lifeline (Lnea Telefnica Nacional para la Prevencin del Suicidio) al 1-800-273-8255. Esta lnea de asistencia al suicida est abierta las 24 horas del da.  Enve un mensaje de texto a la lnea para casos de crisis al [id number] (en los [place] Resumen  El perodo de tiempo despus el parto y hasta 6 a 12 semanas despus   del parto se denomina perodo posparto.  Asista a todas las visitas de seguimiento para usted y su beb.  Revise todos los medicamentos con receta anteriores y actuales para comprobar la posible transferencia a la leche materna.  Pngase en contacto con un mdico si se siente inusualmente triste o preocupada durante el perodo posparto. Esta informacin no tiene como fin reemplazar el consejo del mdico. Asegrese de hacerle al mdico cualquier pregunta que tenga. Document Revised: 01/02/2020 Document Reviewed: 12/10/2019 Elsevier Patient Education  2021 Elsevier Inc.  

## 2020-05-15 LAB — CBC
HCT: 25.8 % — ABNORMAL LOW (ref 36.0–46.0)
Hemoglobin: 8.7 g/dL — ABNORMAL LOW (ref 12.0–15.0)
MCH: 31.8 pg (ref 26.0–34.0)
MCHC: 33.7 g/dL (ref 30.0–36.0)
MCV: 94.2 fL (ref 80.0–100.0)
Platelets: 180 10*3/uL (ref 150–400)
RBC: 2.74 MIL/uL — ABNORMAL LOW (ref 3.87–5.11)
RDW: 13 % (ref 11.5–15.5)
WBC: 15.8 10*3/uL — ABNORMAL HIGH (ref 4.0–10.5)
nRBC: 0 % (ref 0.0–0.2)

## 2020-05-15 MED ORDER — SODIUM CHLORIDE 0.9 % IV SOLN
500.0000 mg | Freq: Once | INTRAVENOUS | Status: DC
  Filled 2020-05-15: qty 25

## 2020-05-15 NOTE — Lactation Note (Addendum)
This note was copied from a baby's chart. Lactation Consultation Note  Patient Name: Judith Collins Date: 05/15/2020 Reason for consult: Follow-up assessment;Late-preterm 34-36.6wks Age:23 hours Consult was done in Spanish: Follow up 32 hours old infant with 1.64% weight loss at the time of visit. Mother states infant took ~43mL of DM via bottle @1630 . Mother reports collecting no EBM when using DEBP.  Mother explains she is getting more when hand expressing and she is giving that EBM with a gloved finger. Reinforced the importance to follow the LPTI recommendations. Talked about lights and noise as well as frequent skin to skin.  Reviewed hand expression and how to use a manual breast pump for colostrum Shares milk production consistent with gestational age.   Infant started showing hunger cues. Encouraged mother to feed infant.   Feeding plan:  1. Breastfeed following hunger cues.  2. Keep infant awake during breastfeeding session: massaging breast, infant's hand/shoulder/feet 3. Offer breast 8 - 12 times in 24h period to establish good milk supply.   4. Pump or hand-express and offer EBM/donor milk following guidelines, paced bottle feeding and fullness cues.   5. Monitor voids and stools as signs good intake 6. Encouraged maternal rest, hydration and food intake.  7. Contact Lactation Services or local resources for support, questions or concerns.    All questions answered at this time.    Maternal Data Has patient been taught Hand Expression?: Yes  Feeding Mother's Current Feeding Choice: Breast Milk and Donor Milk  Lactation Tools Discussed/Used Tools: Pump;Flanges Flange Size: 21 Breast pump type: Double-Electric Breast Pump;Manual Reason for Pumping: LPTI protocol  Interventions Interventions: DEBP;Hand pump;Hand express;Breast massage;Expressed milk;Education  Consult Status Consult Status: Follow-up Date: 05/16/20 Follow-up type:  In-patient    Satish Hammers A Higuera Ancidey 05/15/2020, 6:04 PM

## 2020-05-15 NOTE — Progress Notes (Addendum)
Patient ID: Denae Zulueta, female   DOB: 08/20/1995, 25 y.o.   MRN: [id number]   POSTPARTUM PROGRESS NOTE  Subjective: Kayren Holck Rodas Roselle Locus is a 25 y.o. [id number] s/p SVD at [redacted]w[redacted]d with PPH (900 EBL) secondary to uterine atony resolved with cytotec, txa, methergine, and Jada.  She is on PPD #1 and reports she is doing well. No acute events overnight. She denies any problems with ambulating, voiding or po intake. Denies nausea or vomiting. Pain is well controlled. Bleeding is appropriate.  Objective: Blood pressure 105/63, pulse 76, temperature 98.3 F (36.8 C), temperature source Oral, resp. rate 18, height 5\' 5"  (1.651 m), weight 85.6 kg, last menstrual period 09/06/2019, SpO2 100 %, unknown if currently breastfeeding.  Physical Exam:  General: alert, cooperative and no distress Chest: no respiratory distress Abdomen: soft, non-tender  Uterine Fundus: firm, appropriately tender Extremities: No calf swelling or tenderness, no LE edema  Recent Labs    05/13/20 1553 05/15/20 0457  HGB 13.5 8.7*  HCT 38.3 25.8*    Assessment/Plan: Destanee Bedonie is a 25 y.o. 21 s/p SVD with PPH (900 EBL) at [redacted]w[redacted]d on PPD #1.  # PPH w/ Hgb 13.5>8.7 -- discussed recommendation of IV iron with pt; she declines so will continue po iron supplement every other day  # Routine Postpartum Care: Doing well, pain well-controlled.  -- Continue routine care, lactation support  -- Contraception: condoms -- Feeding: breast  [redacted]w[redacted]d, MD MS3 Medical Student 05/15/2020, 9:17 AM  Attestation of Supervision of Student:  I confirm that I have verified the information documented in the  resident's  note and that I have also personally reperformed the history, physical exam and all medical decision making activities.  I have verified that all services and findings are accurately documented in this student's note; and I agree with management and plan as outlined in the  documentation. I have also made any necessary editorial changes.  07/15/2020, MD [facility] 05/15/2020 12:28 PM

## 2020-05-15 NOTE — Progress Notes (Signed)
POSTPARTUM PROGRESS NOTE  Subjective: Judith Collins is a 25 y.o. [id number] s/p vaginal delivery at [redacted]w[redacted]d.  She reports she doing well. No acute events overnight. She denies any problems with ambulating, voiding or po intake. Denies nausea or vomiting. She has passed flatus. Pain is well controlled.  Lochia is minimal. No lightheadedness or dizziness. Pt declines IV iron.  Objective: Blood pressure 105/63, pulse 76, temperature 98.3 F (36.8 C), temperature source Oral, resp. rate 18, height 5\' 5"  (1.651 m), weight 85.6 kg, last menstrual period 09/06/2019, SpO2 100 %, unknown if currently breastfeeding.  Physical Exam:  General: alert, cooperative and no distress Chest: no respiratory distress Abdomen: soft, non-tender  Uterine Fundus: firm and at level of umbilicus Extremities: No calf swelling or tenderness  no LE edema  Recent Labs    05/13/20 1553 05/15/20 0457  HGB 13.5 8.7*  HCT 38.3 25.8*    Assessment/Plan: Judith Collins is a 25 y.o. 21 s/p vaginal delivery at [redacted]w[redacted]d for preterm labor.  Routine Postpartum Care: Doing well, pain well-controlled.  -- Continue routine care, lactation support  -- Contraception: condoms---will plan to re-address prior to discharge -- Feeding: breast & bottle -- PPH: Hgb 8.7 this AM. Discussed recommendation of IV iron with pt but she declines given asymptomatic. Minimal lochia. Will continue po iron every other day.  Dispo: Plan for discharge PPD#2 given need for baby to stay given preterm delivery.  [redacted]w[redacted]d, MD OB Fellow, Faculty [facility] 05/15/2020 9:35 AM

## 2020-05-16 MED ORDER — ACETAMINOPHEN 500 MG PO TABS: 1000.0000 mg | ORAL_TABLET | Freq: Four times a day (QID) | ORAL | 0 refills | Status: DC

## 2020-05-16 NOTE — Lactation Note (Addendum)
This note was copied from a baby's chart. Lactation Consultation Note  Patient Name: Judith Collins ALPFX'T Date: 05/16/2020 Reason for consult: Follow-up assessment;1st time breastfeeding;Infant < 6lbs;Late-preterm 34-36.6wks Age:23 hours  P2 mother whose infant is now 74 hours old.  This is a LPTI at 35+0 weeks with a  CGA of 35+2 weeks weighing < 6 lbs.  Mother did not breast feed her first child. Mother is supplementing with donor breast milk.    In house Spanish interpreter used for interpretation.  Upon chart review I noticed that mother has hardly attempted breast feeding.  When questioned, mother stated she has been trying a little bit.  The LPTI policy has been reviewed with her.  Mother has been feeding small volumes frequently.  Discussed trying to feed more volume today with a goal of 20 mls or more per feeding.  Mother will feed at least every three hours due to early gestational age and weight; more often as baby desires.  Suggested lots of STS and quiet time in between feedings to allow for adequate rest.  Mother verbalized understanding.  Mother has a DEBP set up in her room but is not interested in using it.  She prefers the hand pump.  Explained the benefits of using the DEBP after every feeding.  Mother does not show a desire to do this.  Offered to assist/observe mother latching, however, she does not desire any lactation assistance either.  Educated mother that it takes time for breast feeding to be well established, especially with a late preterm infant.  Mother verbalized understanding.  Mother will be obtaining a DEBP from her sister after discharge.  Provided coconut oil for comfort and interpreter ordered breakfast for mother.  Asked her to call for my assistance as needed today.   Maternal Data Does the patient have breastfeeding experience prior to this delivery?: No How long did the patient breastfeed?: Attempted but not successful  Feeding Mother's  Current Feeding Choice: Breast Milk and Formula  LATCH Score                    Lactation Tools Discussed/Used Tools: Pump;Coconut oil Breast pump type: Double-Electric Breast Pump;Manual;Other (comment) (Will obtain personal pump from her sister)  Interventions    Discharge    Consult Status Consult Status: Follow-up Date: 05/17/20 Follow-up type: In-patient    Dora Sims 05/16/2020, 8:33 AM

## 2020-05-17 ENCOUNTER — Ambulatory Visit: Payer: Self-pay

## 2020-05-17 NOTE — Lactation Note (Signed)
This note was copied from a baby's chart. Lactation Consultation Note  Patient Name: Judith Collins Date: 05/17/2020   Age:23 hours   LC Follow Up:  Per RN, mother does not desire a lactation visit today.   Maternal Data    Feeding Nipple Type: Extra Slow Flow  LATCH Score Latch: Grasps breast easily, tongue down, lips flanged, rhythmical sucking.  Audible Swallowing: A few with stimulation  Type of Nipple: Everted at rest and after stimulation  Comfort (Breast/Nipple): Soft / non-tender  Hold (Positioning): No assistance needed to correctly position infant at breast.  LATCH Score: 9   Lactation Tools Discussed/Used    Interventions    Discharge    Consult Status      Geraldine Tesar R Cabella Kimm 05/17/2020, 10:34 AM

## 2020-05-17 NOTE — Lactation Note (Signed)
This note was copied from a baby's chart. Lactation Consultation Note Baby is 75 hrs old at time of consult. Baby sleeping soundly. Mom stated she had given him donor milk after she BF him.  RN told LC that baby wasn't BF for very long wanted LC to check on mom and her breast were engorged.  Mom's breast are filling. No knots noted. Mom stated it had been 3 hrs since she last pumped. Reminded mom to pump every 3 hrs and if her breast are full she needs to pump sooner she can. Assess what breast need. Mom stated understanding. Mom pumped, LC massaged breast.  Reviewed BF baby longer than 5 minutes and how much to pump and supplement. LPI paper not found in rm giving to mom and reviewed again.,  Reported to RN.    Patient Name: Judith Collins KCLEX'N Date: 05/17/2020 Reason for consult: Follow-up assessment;Late-preterm 34-36.6wks Age:23 hours  Maternal Data Has patient been taught Hand Expression?: Yes Does the patient have breastfeeding experience prior to this delivery?: Yes How long did the patient breastfeed?: a few days/she tried  Feeding    LATCH Score                    Lactation Tools Discussed/Used Tools: Pump Breast pump type: Double-Electric Breast Pump Pump Education: Setup, frequency, and cleaning;Milk Storage Reason for Pumping: LPI Pumping frequency: q3 Pumped volume: 5 mL  Interventions Interventions: DEBP;Breast massage;Breast compression;Expressed milk;Hand express  Discharge    Consult Status Consult Status: Follow-up Date: 05/17/20 (in afternoon) Follow-up type: In-patient    Charyl Dancer 05/17/2020, 5:37 AM

## 2020-05-18 LAB — SURGICAL PATHOLOGY

## 2020-09-03 ENCOUNTER — Telehealth: Payer: Self-pay

## 2020-09-03 DIAGNOSIS — Z3491 Encounter for supervision of normal pregnancy, unspecified, first trimester: Secondary | ICD-10-CM

## 2020-09-03 NOTE — Telephone Encounter (Signed)
Received call from [facility]. Pt has been seen with them, +UPT and 2 Ultrasounds that are not visible for viability. Pt to have repeat US at [facility].   [place] scheduled at [facility] on 6/29 at 2pm. Will call pt to make aware.   Call placed to pt with Interpreter Eda R. Spoke with pt. Pt given [place] appt and agreeable to plan of care.   Judeth Cornfield, RN

## 2020-09-09 ENCOUNTER — Inpatient Hospital Stay: Admission: RE | Admit: 2020-09-09 | Payer: Self-pay | Source: Ambulatory Visit

## 2020-09-10 ENCOUNTER — Ambulatory Visit (INDEPENDENT_AMBULATORY_CARE_PROVIDER_SITE_OTHER): Payer: Self-pay

## 2020-09-10 ENCOUNTER — Other Ambulatory Visit: Payer: Self-pay

## 2020-09-10 ENCOUNTER — Ambulatory Visit
Admission: RE | Admit: 2020-09-10 | Discharge: 2020-09-10 | Disposition: A | Discharge status: Home / Self Care | Payer: Self-pay | Source: Ambulatory Visit | Attending: Obstetrics & Gynecology | Admitting: Obstetrics & Gynecology

## 2020-09-10 DIAGNOSIS — Z3491 Encounter for supervision of normal pregnancy, unspecified, first trimester: Secondary | ICD-10-CM | POA: Insufficient documentation

## 2020-09-10 NOTE — Progress Notes (Signed)
Pt here today for [place] results. Reviewed results with Dr Donavan Foil. Advised to have repeat US in 2 weeks for viability. Pt states only having spotting for last 8 days. Denies heavy bleeding, cramps. Pt advised to be seen at MAU for heavy bleeding or severe abd pain.    [place] scheduled for 7/14 @ 11am at [facility].   Pt agreeable to plan of care and verbalized understanding.   Judeth Cornfield, RN

## 2020-09-11 ENCOUNTER — Inpatient Hospital Stay (HOSPITAL_COMMUNITY)
Admission: AD | Admit: 2020-09-11 | Discharge: 2020-09-11 | Disposition: A | Discharge status: Home / Self Care | Payer: Self-pay | Attending: Family Medicine | Admitting: Family Medicine

## 2020-09-11 ENCOUNTER — Encounter (HOSPITAL_COMMUNITY): Payer: Self-pay | Admitting: *Deleted

## 2020-09-11 ENCOUNTER — Inpatient Hospital Stay (HOSPITAL_COMMUNITY): Payer: Self-pay

## 2020-09-11 DIAGNOSIS — Z3A01 Less than 8 weeks gestation of pregnancy: Secondary | ICD-10-CM

## 2020-09-11 DIAGNOSIS — Z6790 Unspecified blood type, Rh positive: Secondary | ICD-10-CM

## 2020-09-11 DIAGNOSIS — O200 Threatened abortion: Secondary | ICD-10-CM

## 2020-09-11 DIAGNOSIS — N939 Abnormal uterine and vaginal bleeding, unspecified: Secondary | ICD-10-CM

## 2020-09-11 LAB — WET PREP, GENITAL
Clue Cells Wet Prep HPF POC: NONE SEEN
Sperm: NONE SEEN
Trich, Wet Prep: NONE SEEN
Yeast Wet Prep HPF POC: NONE SEEN

## 2020-09-11 LAB — URINALYSIS, ROUTINE W REFLEX MICROSCOPIC
Bilirubin Urine: NEGATIVE
Glucose, UA: NEGATIVE mg/dL
Ketones, ur: NEGATIVE mg/dL
Leukocytes,Ua: NEGATIVE
Nitrite: NEGATIVE
Protein, ur: NEGATIVE mg/dL
RBC / HPF: >50 RBC/hpf — ABNORMAL HIGH (ref 0–5)
Specific Gravity, Urine: 1.013 (ref 1.005–1.030)
pH: 5 (ref 5.0–8.0)

## 2020-09-11 LAB — CBC
HCT: 39.2 % (ref 36.0–46.0)
Hemoglobin: 12.6 g/dL (ref 12.0–15.0)
MCH: 26.5 pg (ref 26.0–34.0)
MCHC: 32.1 g/dL (ref 30.0–36.0)
MCV: 82.5 fL (ref 80.0–100.0)
Platelets: 348 10*3/uL (ref 150–400)
RBC: 4.75 MIL/uL (ref 3.87–5.11)
RDW: 14.2 % (ref 11.5–15.5)
WBC: 8.4 10*3/uL (ref 4.0–10.5)
nRBC: 0 % (ref 0.0–0.2)

## 2020-09-11 NOTE — MAU Note (Signed)
A wk ago, started having brown d/c, last night it became red- thin pad, has changed 3 times.  Pt is pregnant, confirmed yesterday  at [facility]. Denies pain. First seen at [facility]. + on [place] yesterday- TWINS [redacted]w[redacted]d.

## 2020-09-11 NOTE — MAU Provider Note (Addendum)
Patient  Judith Collins is a 25 y.o. [id number]  At 3337w0d by early US with mono-mono twins here with complaints of vaginal bleeding. She denies abdominal pain, dysuria, NV, diarrhea, constipation. She was diagnosed with mono-mono twins yesterday at 5 weeks, 6 days. US showed "probable" embryo, no FHR yet. She reports that last night and today she has been bleeding more so she came in to be seen.   She denies any chronic health complications.  History     CSN: [id number]  Arrival date and time: 09/11/20 1638  Chief Complaint  Patient presents with   Vaginal Bleeding   Vaginal Bleeding The patient's primary symptoms include vaginal bleeding. This is a new problem. The current episode started today. The problem occurs constantly. The problem has been gradually worsening. The patient is experiencing no pain. She is pregnant. Pertinent negatives include no abdominal pain, dysuria or fever. The vaginal discharge was bloody. The vaginal bleeding is typical of menses. She has been passing clots. She has been passing tissue.   OB History     Gravida  3   Para  2   Term  1   Preterm  1   AB      Living  2      SAB      IAB      Ectopic      Multiple  0   Live Births  2           Past Medical History:  Diagnosis Date   Asthma    Bronchitis     Past Surgical History:  Procedure Laterality Date   NO PAST SURGERIES      Family History  Problem Relation Age of Onset   Asthma Mother     Social History   Tobacco Use   Smoking status: Never   Smokeless tobacco: Never  Vaping Use   Vaping Use: Never used  Substance Use Topics   Alcohol use: No   Drug use: No    Allergies:  Allergies  Allergen Reactions   Lactose Intolerance (Gi)     Medications Prior to Admission  Medication Sig Dispense Refill Last Dose   Prenatal Vit-Fe Fumarate-FA (PRENATAL VITAMIN PO) Take by mouth.   09/11/2020   acetaminophen (TYLENOL) 500 MG tablet Take 2 tablets (1,000  mg total) by mouth every 6 (six) hours. 30 tablet 0    ferrous sulfate 325 (65 FE) MG tablet Take 1 tablet (325 mg total) by mouth every other day. (Patient not taking: Reported on 03/25/2020) 16 tablet 0    ibuprofen (ADVIL) 600 MG tablet Take 1 tablet (600 mg total) by mouth every 6 (six) hours. (Patient not taking: Reported on 03/25/2020) 30 tablet 0    polyethylene glycol powder (GLYCOLAX/MIRALAX) powder Take 17 g by mouth daily as needed. (Patient not taking: No sig reported) 255 g 0     Review of Systems  Constitutional: Negative.  Negative for fever.  HENT: Negative.    Respiratory: Negative.    Cardiovascular: Negative.   Gastrointestinal:  Negative for abdominal pain.  Genitourinary:  Positive for vaginal bleeding. Negative for dysuria.  Neurological: Negative.   Psychiatric/Behavioral: Negative.    Physical Exam   Blood pressure 111/62, pulse 92, temperature 98.9 F (37.2 C), temperature source Oral, resp. rate 15, height 5\' 3"  (1.6 m), weight 81.1 kg, SpO2 100 %, unknown if currently breastfeeding.  Physical Exam Constitutional:      Appearance: Normal appearance.  Pulmonary:  Effort: Pulmonary effort is normal.  Abdominal:     General: Abdomen is flat.     Palpations: Abdomen is soft.  Genitourinary:    General: Normal vulva.     Vagina: Vaginal discharge present.     Comments: NEFG; dark red blood in the vagina, no clots, OS is visually open and 1 cm dilated externally. No CMT, suprapubic or adnexal tenderness.  Musculoskeletal:        General: Normal range of motion.     Cervical back: Normal range of motion.  Neurological:     Mental Status: She is alert.   Results for orders placed or performed during the hospital encounter of 09/11/20 (from the past 24 hour(s))  Urinalysis, Routine w reflex microscopic Urine, Clean Catch     Status: Abnormal   Collection Time: 09/11/20  5:21 PM  Result Value Ref Range   Color, Urine YELLOW YELLOW   APPearance HAZY (A)  CLEAR   Specific Gravity, Urine 1.013 1.005 - 1.030   pH 5.0 5.0 - 8.0   Glucose, UA NEGATIVE NEGATIVE mg/dL   Hgb urine dipstick LARGE (A) NEGATIVE   Bilirubin Urine NEGATIVE NEGATIVE   Ketones, ur NEGATIVE NEGATIVE mg/dL   Protein, ur NEGATIVE NEGATIVE mg/dL   Nitrite NEGATIVE NEGATIVE   Leukocytes,Ua NEGATIVE NEGATIVE   RBC / HPF >50 (H) 0 - 5 RBC/hpf   WBC, UA 0-5 0 - 5 WBC/hpf   Bacteria, UA FEW (A) NONE SEEN   Squamous Epithelial / LPF 0-5 0 - 5   Mucus PRESENT   CBC     Status: None   Collection Time: 09/11/20  6:36 PM  Result Value Ref Range   WBC 8.4 4.0 - 10.5 K/uL   RBC 4.75 3.87 - 5.11 MIL/uL   Hemoglobin 12.6 12.0 - 15.0 g/dL   HCT 84.6 65.9 - 93.5 %   MCV 82.5 80.0 - 100.0 fL   MCH 26.5 26.0 - 34.0 pg   MCHC 32.1 30.0 - 36.0 g/dL   RDW 70.1 77.9 - 39.0 %   Platelets 348 150 - 400 K/uL   nRBC 0.0 0.0 - 0.2 %  Wet prep, genital     Status: Abnormal   Collection Time: 09/11/20  7:31 PM   Specimen: PATH Cytology Cervicovaginal Ancillary Only  Result Value Ref Range   Yeast Wet Prep HPF POC NONE SEEN NONE SEEN   Trich, Wet Prep NONE SEEN NONE SEEN   Clue Cells Wet Prep HPF POC NONE SEEN NONE SEEN   WBC, Wet Prep HPF POC MANY (A) NONE SEEN   Sperm NONE SEEN    US OB Comp AddL Gest Less 14 Wks  Result Date: 09/11/2020 CLINICAL DATA:  Vaginal bleeding EXAM: US OB TRANSVAGINAL TECHNIQUE: Transvaginal ultrasound was performed for complete evaluation of the gestation as well as the maternal uterus, adnexal regions, and pelvic cul-de-sac. COMPARISON:  Ultrasound 09/10/2020 FINDINGS: Number of IUPs:  2 Chorionicity/Amnionicity: Monochorionic-monoamniotic (no separating membrane seen) TWIN 1 Yolk sac:  Visualized. Embryo:  Visualized. Cardiac Activity: Not Visualized. CRL:  3 mm   5 w 6 d                  [place] EDC: 05/08/2021 TWIN 2 Yolk sac:  Visualized. Embryo:  Visualized. Cardiac Activity: Not Visualized. CRL:  3.7 mm   6 w 0 d                  [place] EDC: 05/07/2021 Subchorionic  hemorrhage:  None  visualized. Maternal uterus/adnexae: Small volume of fluid with few low level internal echoes towards the endocervical canal, compatible with reported vaginal bleeding. Anteverted maternal uterus is otherwise unremarkable. Right ovary measures 2.9 x 2.4 x 2.9 cm left ovary not well visualized. No pelvic free fluid. IMPRESSION: Suspect monochorionic/monoamniotic twin pregnancy. Demonstrable fetal poles without visible cardiac activity. Findings are suspicious but not yet definitive for failed pregnancy. Recommend follow-up US in 10-14 days for definitive diagnosis. This recommendation follows SRU consensus guidelines: Diagnostic Criteria for Nonviable Pregnancy Early in the First Trimester. Malva Limes Med 2013; 831:5176-16. Small amount fluid towards the endocervical canal compatible with reported vaginal bleeding. No other significant change from comparison sonography 1 day prior. Electronically Signed   By: Kreg Shropshire M.D.   On: 09/11/2020 21:16   US OB Transvaginal  Result Date: 09/11/2020 CLINICAL DATA:  Vaginal bleeding EXAM: US OB TRANSVAGINAL TECHNIQUE: Transvaginal ultrasound was performed for complete evaluation of the gestation as well as the maternal uterus, adnexal regions, and pelvic cul-de-sac. COMPARISON:  Ultrasound 09/10/2020 FINDINGS: Number of IUPs:  2 Chorionicity/Amnionicity: Monochorionic-monoamniotic (no separating membrane seen) TWIN 1 Yolk sac:  Visualized. Embryo:  Visualized. Cardiac Activity: Not Visualized. CRL:  3 mm   5 w 6 d                  [place] EDC: 05/08/2021 TWIN 2 Yolk sac:  Visualized. Embryo:  Visualized. Cardiac Activity: Not Visualized. CRL:  3.7 mm   6 w 0 d                  [place] EDC: 05/07/2021 Subchorionic hemorrhage:  None visualized. Maternal uterus/adnexae: Small volume of fluid with few low level internal echoes towards the endocervical canal, compatible with reported vaginal bleeding. Anteverted maternal uterus is otherwise unremarkable. Right ovary  measures 2.9 x 2.4 x 2.9 cm left ovary not well visualized. No pelvic free fluid. IMPRESSION: Suspect monochorionic/monoamniotic twin pregnancy. Demonstrable fetal poles without visible cardiac activity. Findings are suspicious but not yet definitive for failed pregnancy. Recommend follow-up US in 10-14 days for definitive diagnosis. This recommendation follows SRU consensus guidelines: Diagnostic Criteria for Nonviable Pregnancy Early in the First Trimester. Malva Limes Med 2013; 073:7106-26. Small amount fluid towards the endocervical canal compatible with reported vaginal bleeding. No other significant change from comparison sonography 1 day prior. Electronically Signed   By: Kreg Shropshire M.D.   On: 09/11/2020 21:16    MAU Course  Procedures  MDM -due to patient increase in bleeding and report of passing clots and tissue, will send for repeat US -wet prep pending -GC CT pending -CBC   Long discussion with patient about possible miscarriage, especially given amount of bleeding and the fact that [place] was not definitive yesterday. Patient is accepting of the situation.   Patient care endorsed to [place], [place] at [address] Luna Kitchens, CNM  Reviewed [place] with patient, no change from yesterday. VB could indicate an impending miscarriage but its too early to tell if the pregnancy will be viable. Pt verbalizes understanding and has f/u US scheduled. Stable for discharge home.   Assessment and Plan   1. [redacted] weeks gestation of pregnancy   2. Vaginal bleeding   3. Threatened miscarriage   4. Blood type, Rh positive    Discharge home Follow up at [facility] for [place] as scheduled Bleeding/SAB precautions Pelvic rest  Allergies as of 09/11/2020       Reactions   Lactose Intolerance (gi)  Medication List     STOP taking these medications    ferrous sulfate 325 (65 FE) MG tablet   ibuprofen 600 MG tablet Commonly known as: ADVIL   polyethylene glycol powder 17 GM/SCOOP powder Commonly known  as: GLYCOLAX/MIRALAX       TAKE these medications    acetaminophen 500 MG tablet Commonly known as: TYLENOL Take 2 tablets (1,000 mg total) by mouth every 6 (six) hours.   PRENATAL VITAMIN PO Take by mouth.        Live interpreter present for patient interaction  Donette Larry, [place]  09/11/2020 9:42 PM

## 2020-09-15 LAB — GC/CHLAMYDIA PROBE AMP (~~LOC~~) NOT AT ARMC
Chlamydia: NEGATIVE
Comment: NEGATIVE
Comment: NORMAL
Neisseria Gonorrhea: NEGATIVE

## 2020-09-15 NOTE — Progress Notes (Signed)
Patient was assessed and managed by nursing staff during this encounter. I have reviewed the chart and agree with the documentation and plan. I have also made any necessary editorial changes.  Warden Fillers, MD 09/15/2020 8:21 AM

## 2020-09-24 ENCOUNTER — Inpatient Hospital Stay: Admission: RE | Admit: 2020-09-24 | Payer: Self-pay | Source: Ambulatory Visit

## 2022-01-13 LAB — OB RESULTS CONSOLE ABO/RH: RH Type: POSITIVE

## 2022-01-13 LAB — OB RESULTS CONSOLE HEPATITIS B SURFACE ANTIGEN: Hepatitis B Surface Ag: NEGATIVE

## 2022-01-13 LAB — OB RESULTS CONSOLE ANTIBODY SCREEN: Antibody Screen: NEGATIVE

## 2022-01-13 LAB — OB RESULTS CONSOLE RUBELLA ANTIBODY, IGM: Rubella: IMMUNE

## 2022-01-13 LAB — HEPATITIS C ANTIBODY: HCV Ab: NEGATIVE

## 2022-01-13 LAB — OB RESULTS CONSOLE GC/CHLAMYDIA
Chlamydia: NEGATIVE
Neisseria Gonorrhea: NEGATIVE

## 2022-01-13 LAB — OB RESULTS CONSOLE HIV ANTIBODY (ROUTINE TESTING): HIV: NONREACTIVE

## 2022-01-13 LAB — OB RESULTS CONSOLE VARICELLA ZOSTER ANTIBODY, IGG: Varicella: IMMUNE

## 2022-01-13 LAB — OB RESULTS CONSOLE RPR: RPR: NONREACTIVE

## 2022-01-24 IMAGING — US US OB < 14 WEEKS - US OB TV
1 series · 15 of 28 positions shown · non-contrast
Comparison: None.

CLINICAL DATA: Dating

EXAM:
OBSTETRIC <14 WK US AND TRANSVAGINAL OB US
TECHNIQUE: Both transabdominal and transvaginal ultrasound examinations were
performed for complete evaluation of the gestation as well as the
maternal uterus, adnexal regions, and pelvic cul-de-sac.
Transvaginal technique was performed to assess early pregnancy.

[Series 1: us ob < 14 weeks - us ob tv · 15 of 67 slices shown]
[im 1/67]
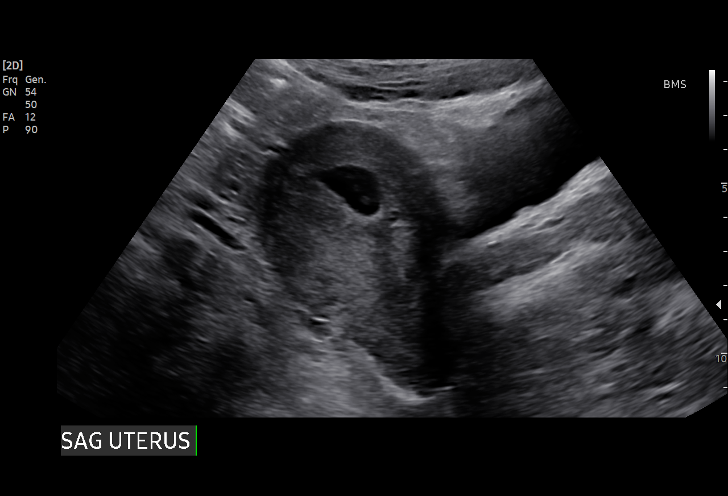
[im 5/67]
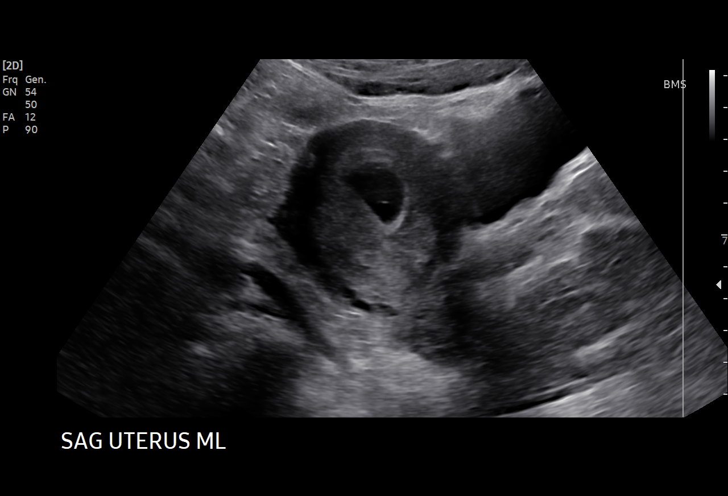
[im 10/67]
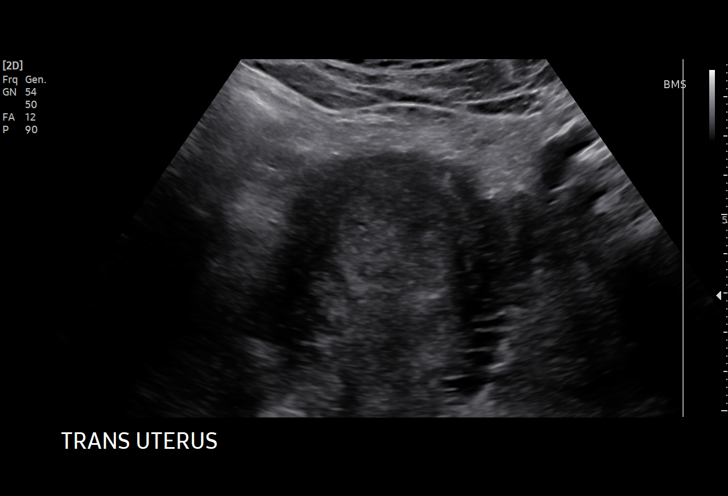
[im 15/67]
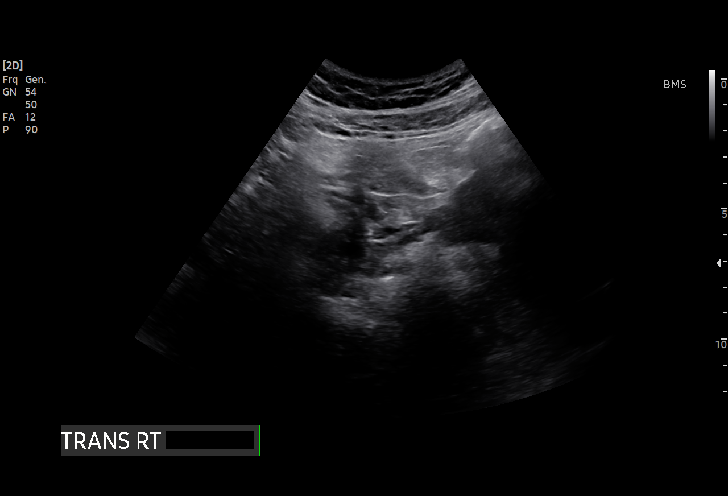
[im 20/67]
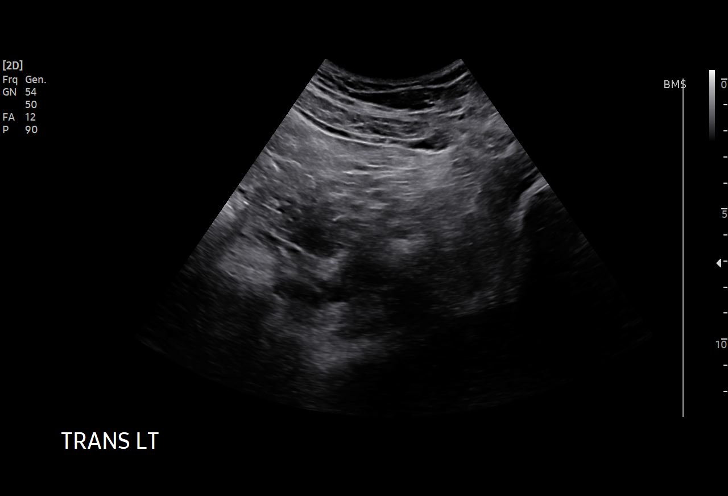
[im 25/67]
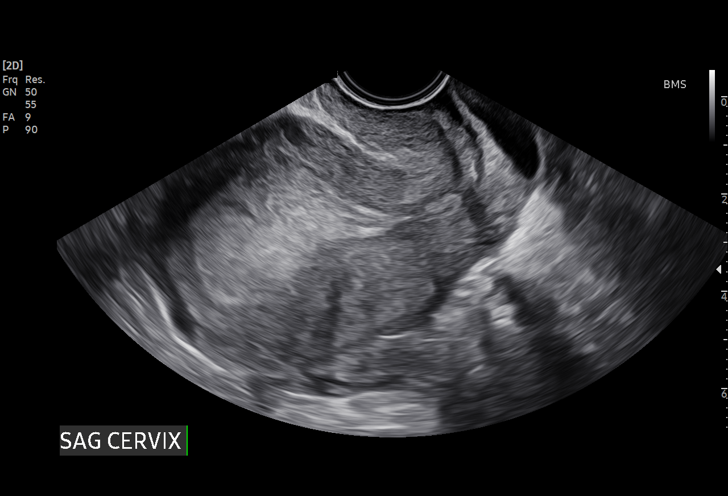
[im 30/67]
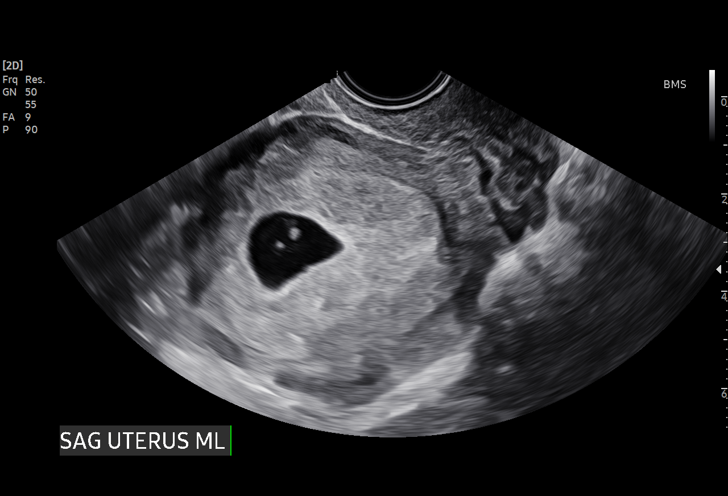
[im 35/67]
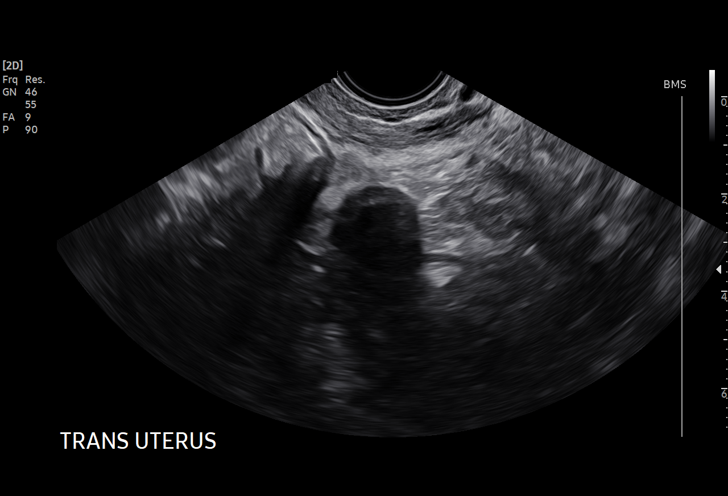
[im 37/67]
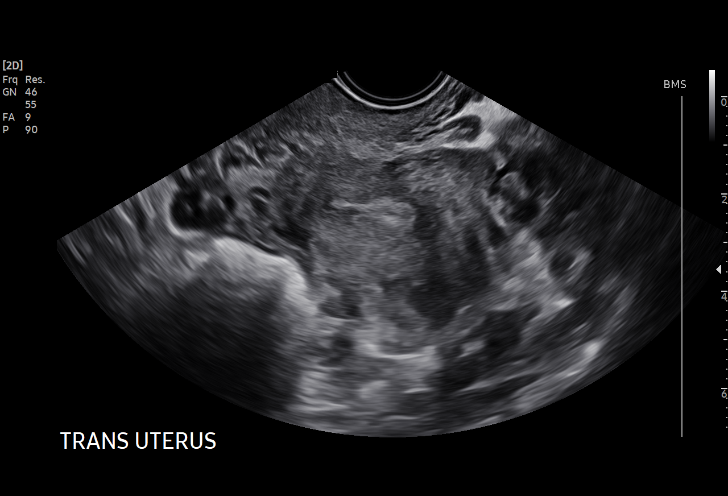
[im 42/67]
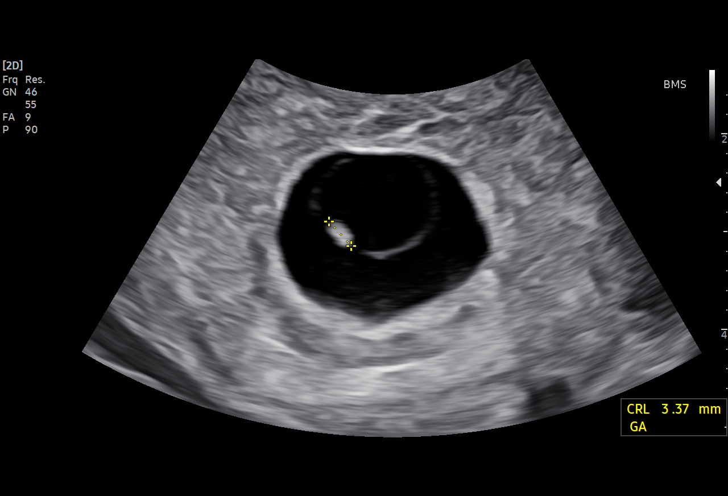
[im 47/67]
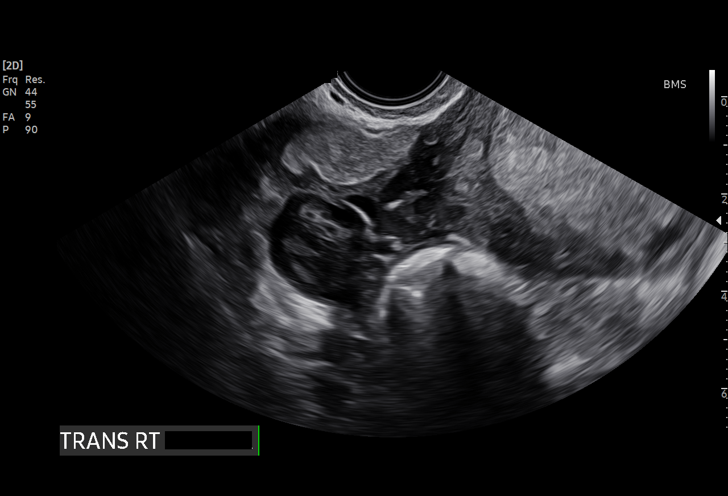
[im 52/67]
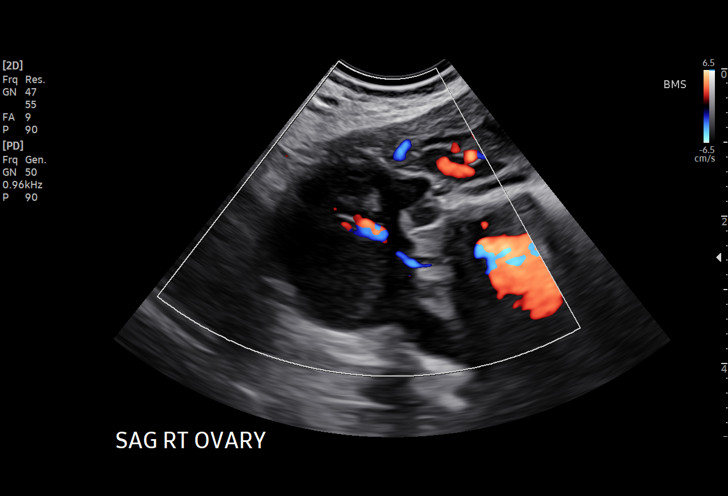
[im 57/67]
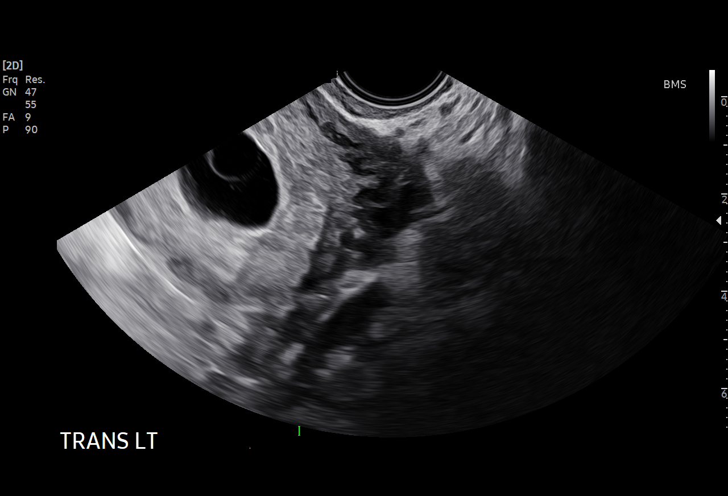
[im 62/67]
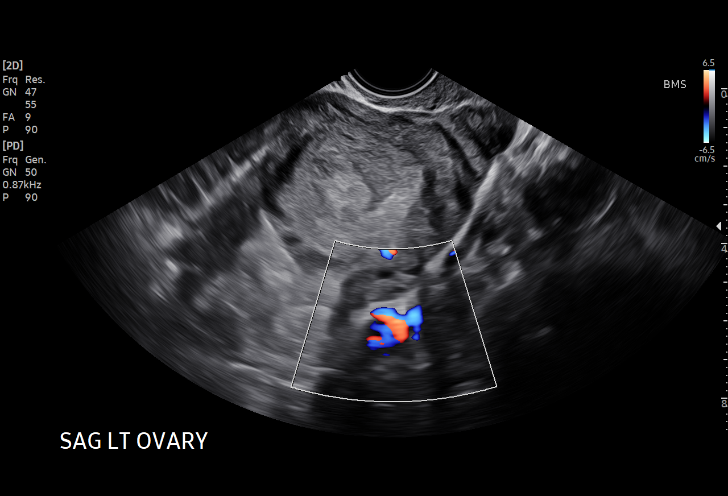
[im 67/67]
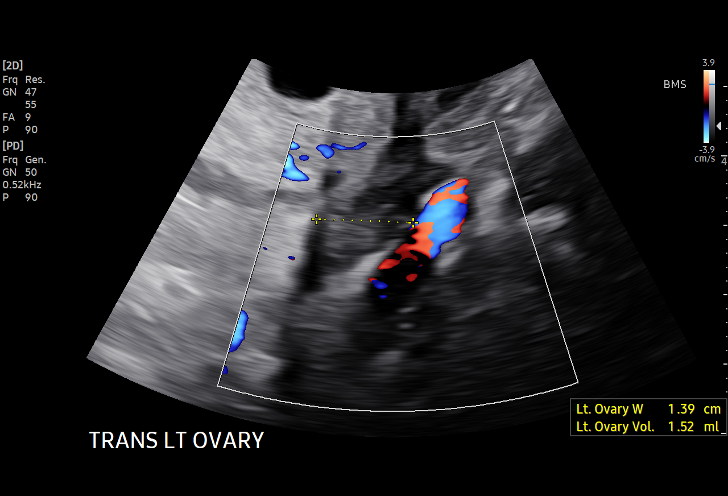

[15 of 28 positions shown; findings below may reference images not displayed]

FINDINGS: Intrauterine gestational sac: Single intrauterine gestational sac.

Yolk sac: Single yolk sac. Yolk sac appears enlarged, measuring 12
mm.

Embryo:  Probable 2 embryos.

Cardiac Activity: Not visualized

Suspect mono chorionic/mono amniotic

Fetus 1: CRL: 3.2 mm 5 w 6 d                  US EDC: 05/07/2021

Fetus 2: CRL: 2.6 mm   5 w   6 d                  US EDC: 05/07/2021

Subchorionic hemorrhage:  None visualized.

Maternal uterus/adnexae: Ovaries are within normal limits. Right
ovary measures 2.2 by 1.9 by 2.7 cm. The left ovary measures 2.3 x
0.9 by 1.4 cm. No significant free fluid
IMPRESSION: 1. Probable mono chorionic/mono amniotic twin pregnancy. No fetal
cardiac activity identified within either fetal pole. Additional
finding of enlarged appearing yolk sac. Findings are suspicious but
not yet definitive for failed pregnancy. Recommend follow-up US in
10-14 days for definitive diagnosis. This recommendation follows SRU
consensus guidelines: Diagnostic Criteria for Nonviable Pregnancy
Early in the First Trimester. N Engl J Med 5928; [DATE].

## 2022-01-25 IMAGING — US US OB TRANSVAGINAL
1 series · 15 of 28 positions shown · non-contrast
Comparison: Ultrasound 09/10/2020

CLINICAL DATA: Vaginal bleeding

EXAM:
US OB TRANSVAGINAL
TECHNIQUE: Transvaginal ultrasound was performed for complete evaluation of the
gestation as well as the maternal uterus, adnexal regions, and
pelvic cul-de-sac.

[Series 1: us ob transvaginal · 47 acquisitions, 15 frames shown]
[im 1/47]
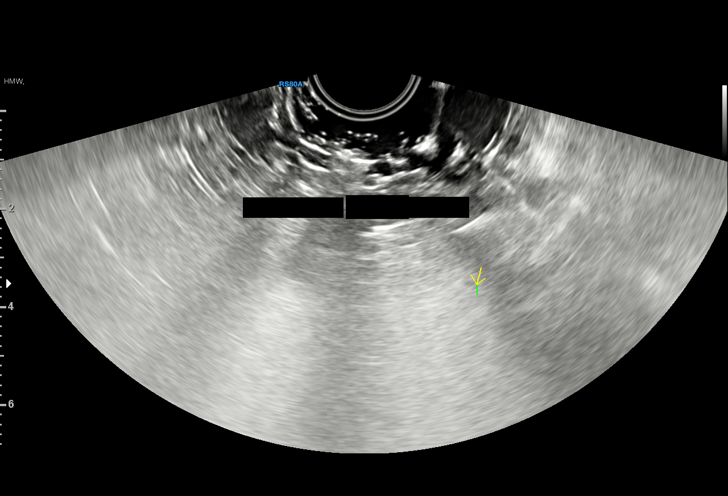
[im 4/47]
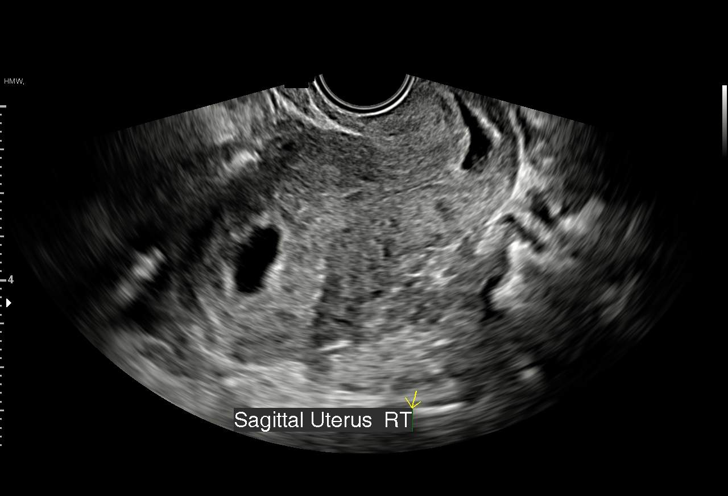
[im 7/47]
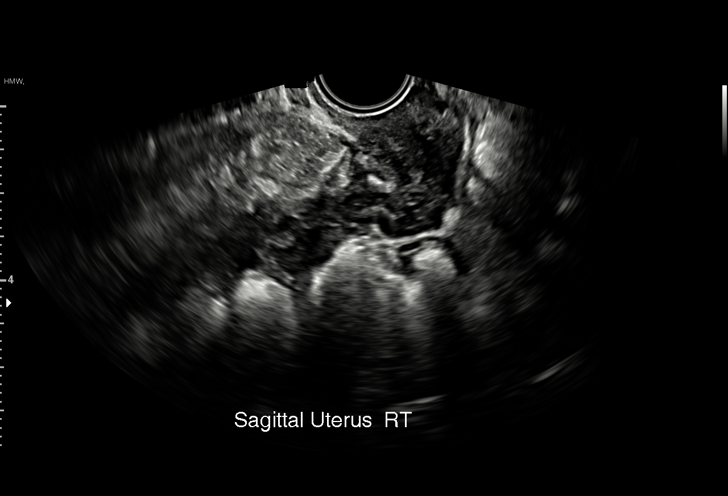
[im 11/47]
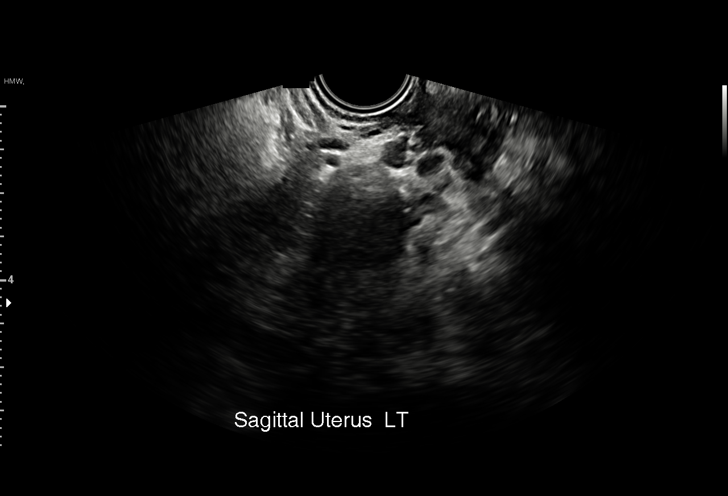
[im 14/47]
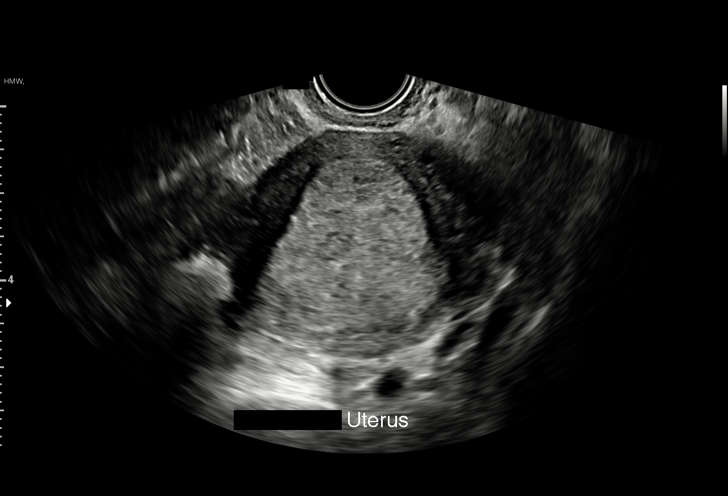
[im 18/47]
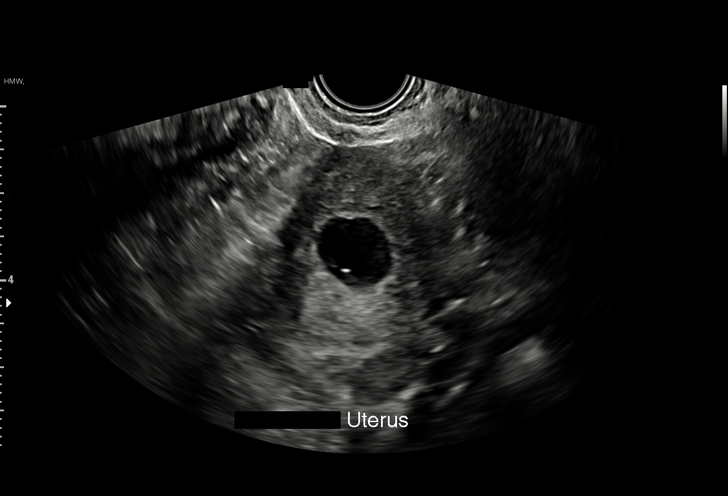
[im 21/47]
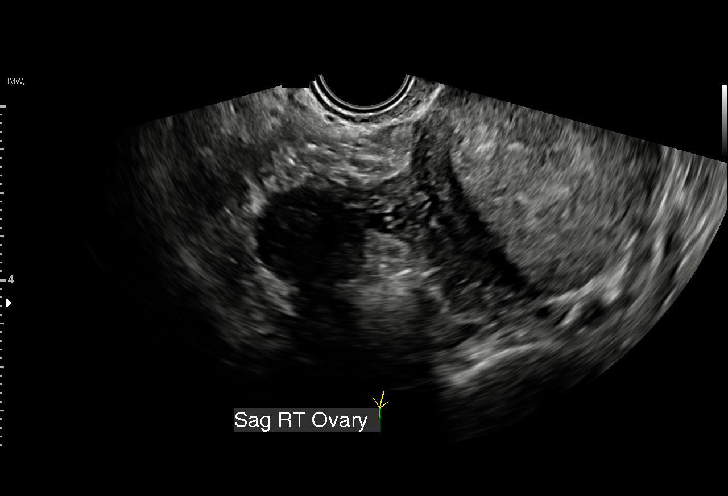
[im 24/47]
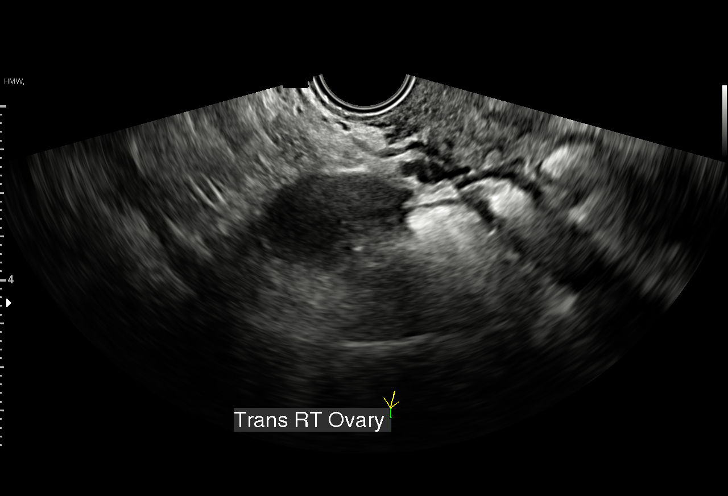
[im 26/47]
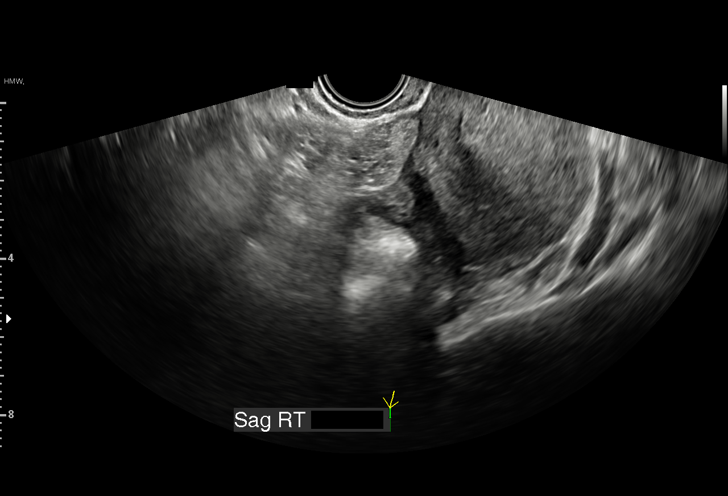
[im 29/47]
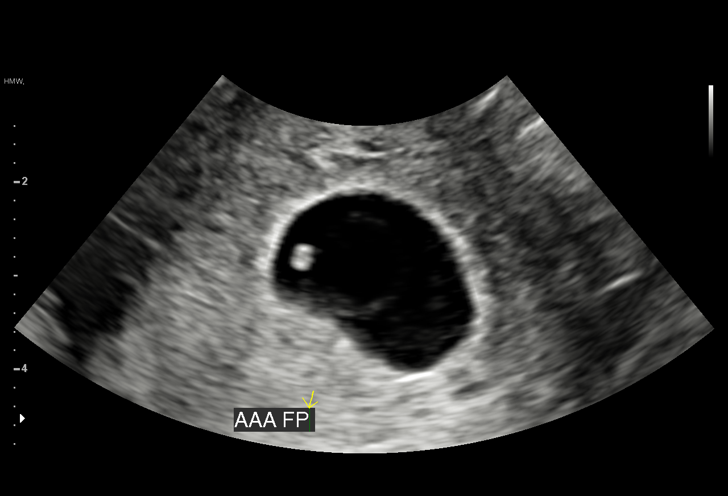
[im 33/47]
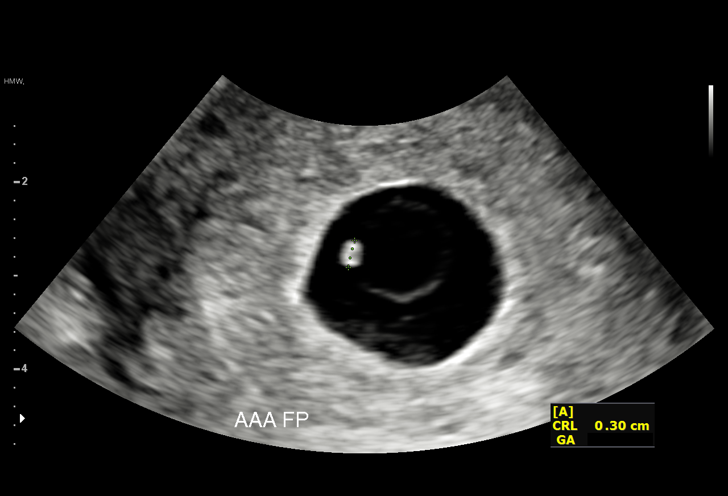
[im 36/47]
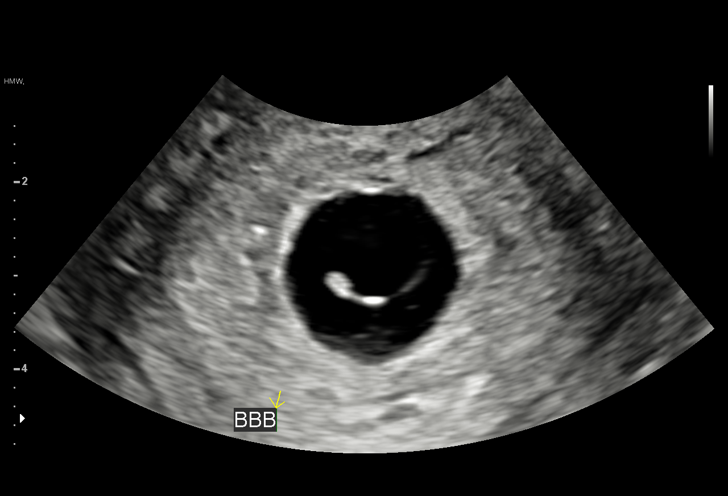
[im 40/47]
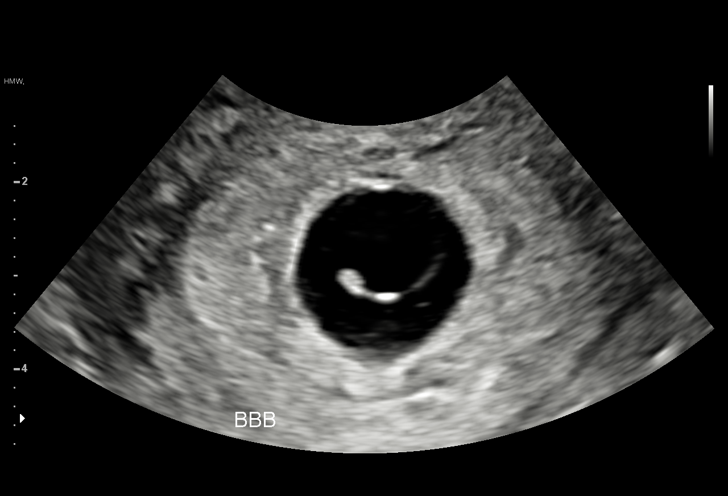
[im 43/47]
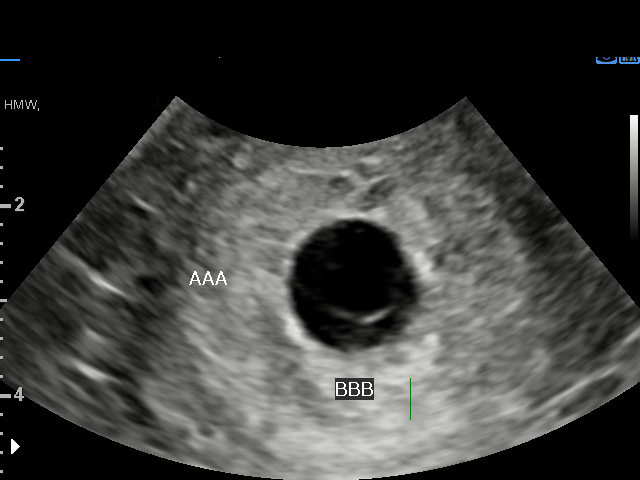
[im 47/47]
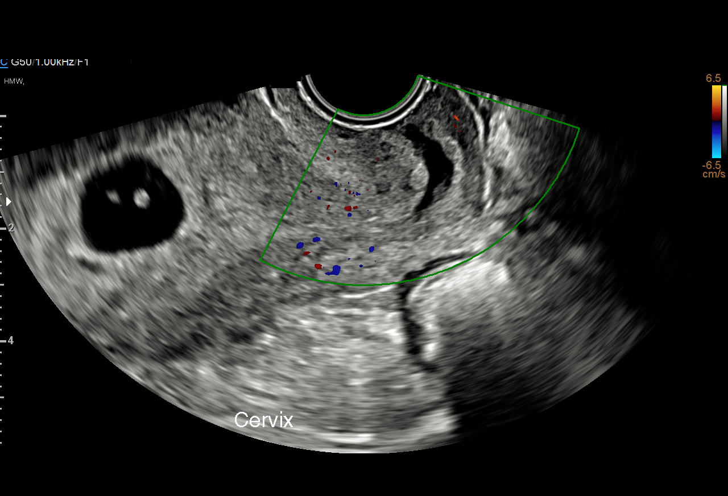

[15 of 28 positions shown; findings below may reference images not displayed]

FINDINGS: Number of IUPs:  2

Chorionicity/Amnionicity: Monochorionic-monoamniotic (no separating
membrane seen)

TWIN 1

Yolk sac:  Visualized.

Embryo:  Visualized.

Cardiac Activity: Not Visualized.

CRL:  3 mm   5 w 6 d                  US EDC: 05/08/2021

TWIN 2

Yolk sac:  Visualized.

Embryo:  Visualized.

Cardiac Activity: Not Visualized.

CRL:  3.7 mm   6 w 0 d                  US EDC: 05/07/2021

Subchorionic hemorrhage:  None visualized.

Maternal uterus/adnexae: Small volume of fluid with few low level
internal echoes towards the endocervical canal, compatible with
reported vaginal bleeding. Anteverted maternal uterus is otherwise
unremarkable. Right ovary measures 2.9 x 2.4 x 2.9 cm left ovary not
well visualized. No pelvic free fluid.
IMPRESSION: Suspect monochorionic/monoamniotic twin pregnancy. Demonstrable
fetal poles without visible cardiac activity. Findings are
suspicious but not yet definitive for failed pregnancy. Recommend
follow-up US in 10-14 days for definitive diagnosis. This
recommendation follows SRU consensus guidelines: Diagnostic Criteria
for Nonviable Pregnancy Early in the First Trimester. N Engl J Med
5478; [DATE].

Small amount fluid towards the endocervical canal compatible with
reported vaginal bleeding.

No other significant change from comparison sonography 1 day prior.

## 2022-03-14 NOTE — L&D Delivery Note (Addendum)
Delivery Note Judith Collins is a 27 y.o. [id number] at [redacted]w[redacted]d admitted for Labor with SROM at home.   GBS Status:  Positive/-- (04/01 0000) Maximum Maternal Temperature: 36.8C  Labor course: Presented after ROM at home. Confirmed in prenatal office visit. Initial SVE: 5/60/-3. She was given PCN > 4 hours from delivery. She received epidural for pain control. She then progressed to complete without any augmentation. She received TXA x1 at the beginning of second stage. ROM: 11h 39m with clear fluid  Birth: At 2257 a viable female was delivered via spontaneous vaginal delivery (Presentation: LOA;  ). Nuchal cord present: No.  Shoulders were delivered slowly at first. Posterior axilla (left axilla) was rotated counterclockwise to maternal anterior and both shoulders were delivered subsequently. At no time was excessive traction placed on the head.  Body delivered in usual fashion. Infant placed directly on mom's abdomen for bonding/skin-to-skin, baby dried and stimulated. Cord clamped x 2 after 1 minute and cut by FOB.  Cord blood collected.  The placenta separated spontaneously and delivered via gentle cord traction.  Pitocin infused rapidly IV per protocol.  Fundus remained boggy intermittently and she continued to have gushes of bleeding despite fundal massage, misoprostol ( 890mcg rectally). Jada device was placed and she received a dose of IM Methergine. Bleeding slowed down after and Jada device was left in place. Her vital signs remained stable. Dr. Nelda Marseille in room when [facility] placed Placenta inspected and appears to be intact with a 3 VC.  Placenta/Cord with the following complications: none .  Sponge and instrument count were correct x2.  Intrapartum complications:  uterine atony Anesthesia:  epidural Episiotomy: none Lacerations:  none Suture Repair:  none EBL (mL): 905cc    Infant: APGAR (1 MIN): 9   APGAR (5 MINS): 9   APGAR (10 MINS):    Infant weight: pending  Mom to  postpartum.  Baby to Couplet care / Skin to Skin. Placenta to L&D   Plans to Breast and bottlefeed Contraception: IUD outpatient Circumcision: N/A  Delivery was attended by [name], CNM. [name], M.D. PGY-2 Family Medicine Visiting Resident Faculty Practice 06/14/2022 12:07 AM   The above was performed under my direct supervision and guidance.

## 2022-05-19 LAB — OB RESULTS CONSOLE GBS
GBS: NEGATIVE
GBS: POSITIVE

## 2022-06-13 ENCOUNTER — Encounter (HOSPITAL_COMMUNITY): Payer: Self-pay | Admitting: Family Medicine

## 2022-06-13 ENCOUNTER — Inpatient Hospital Stay (HOSPITAL_COMMUNITY): Payer: Medicaid Other | Admitting: Anesthesiology

## 2022-06-13 ENCOUNTER — Inpatient Hospital Stay (HOSPITAL_COMMUNITY)
Admission: AD | Admit: 2022-06-13 | Discharge: 2022-06-15 | DRG: 768 | Disposition: A | Discharge status: Home / Self Care | Payer: Medicaid Other | Attending: Family Medicine | Admitting: Family Medicine

## 2022-06-13 ENCOUNTER — Other Ambulatory Visit: Payer: Self-pay

## 2022-06-13 DIAGNOSIS — Z3A40 40 weeks gestation of pregnancy: Secondary | ICD-10-CM

## 2022-06-13 DIAGNOSIS — O99824 Streptococcus B carrier state complicating childbirth: Secondary | ICD-10-CM | POA: Diagnosis present

## 2022-06-13 DIAGNOSIS — O480 Post-term pregnancy: Secondary | ICD-10-CM | POA: Diagnosis present

## 2022-06-13 DIAGNOSIS — O99820 Streptococcus B carrier state complicating pregnancy: Secondary | ICD-10-CM

## 2022-06-13 DIAGNOSIS — Z8759 Personal history of other complications of pregnancy, childbirth and the puerperium: Secondary | ICD-10-CM

## 2022-06-13 DIAGNOSIS — O99214 Obesity complicating childbirth: Secondary | ICD-10-CM | POA: Diagnosis present

## 2022-06-13 DIAGNOSIS — O4292 Full-term premature rupture of membranes, unspecified as to length of time between rupture and onset of labor: Secondary | ICD-10-CM | POA: Diagnosis present

## 2022-06-13 LAB — CBC
HCT: 34.9 % — ABNORMAL LOW (ref 36.0–46.0)
Hemoglobin: 11.7 g/dL — ABNORMAL LOW (ref 12.0–15.0)
MCH: 30.8 pg (ref 26.0–34.0)
MCHC: 33.5 g/dL (ref 30.0–36.0)
MCV: 91.8 fL (ref 80.0–100.0)
Platelets: 197 10*3/uL (ref 150–400)
RBC: 3.8 MIL/uL — ABNORMAL LOW (ref 3.87–5.11)
RDW: 13.6 % (ref 11.5–15.5)
WBC: 10.8 10*3/uL — ABNORMAL HIGH (ref 4.0–10.5)
nRBC: 0 % (ref 0.0–0.2)

## 2022-06-13 LAB — TYPE AND SCREEN
ABO/RH(D): B POS
Antibody Screen: NEGATIVE

## 2022-06-13 LAB — OB RESULTS CONSOLE GBS: GBS: POSITIVE

## 2022-06-13 MED ORDER — FENTANYL-BUPIVACAINE-NACL 0.5-0.125-0.9 MG/250ML-% EP SOLN
12.0000 mL/h | EPIDURAL | Status: DC | PRN
  Administered 2022-06-13: 12 mL/h via EPIDURAL
  Filled 2022-06-13: qty 250

## 2022-06-13 MED ORDER — EPHEDRINE 5 MG/ML INJ: 10.0000 mg | INTRAVENOUS | Status: DC | PRN

## 2022-06-13 MED ORDER — LIDOCAINE HCL (PF) 1 % IJ SOLN
INTRAMUSCULAR | Status: DC | PRN
  Administered 2022-06-13: 2 mL via EPIDURAL
  Administered 2022-06-13: 3 mL via EPIDURAL
  Administered 2022-06-13: 5 mL via EPIDURAL

## 2022-06-13 MED ORDER — METHYLERGONOVINE MALEATE 0.2 MG/ML IJ SOLN
INTRAMUSCULAR | Status: AC
  Administered 2022-06-13: 0.2 mg via INTRAMUSCULAR
  Filled 2022-06-13: qty 1

## 2022-06-13 MED ORDER — PHENYLEPHRINE 80 MCG/ML (10ML) SYRINGE FOR IV PUSH (FOR BLOOD PRESSURE SUPPORT): 80.0000 ug | PREFILLED_SYRINGE | INTRAVENOUS | Status: DC | PRN

## 2022-06-13 MED ORDER — LACTATED RINGERS IV SOLN: 500.0000 mL | INTRAVENOUS | Status: DC | PRN

## 2022-06-13 MED ORDER — DIPHENHYDRAMINE HCL 50 MG/ML IJ SOLN: 12.5000 mg | INTRAMUSCULAR | Status: DC | PRN

## 2022-06-13 MED ORDER — MISOPROSTOL 200 MCG PO TABS
ORAL_TABLET | ORAL | Status: AC
  Filled 2022-06-13: qty 4

## 2022-06-13 MED ORDER — METHYLERGONOVINE MALEATE 0.2 MG/ML IJ SOLN: 0.2000 mg | INTRAMUSCULAR | Status: AC

## 2022-06-13 MED ORDER — OXYTOCIN-SODIUM CHLORIDE 30-0.9 UT/500ML-% IV SOLN
2.5000 [IU]/h | INTRAVENOUS | Status: DC
  Administered 2022-06-13: 2.5 [IU]/h via INTRAVENOUS
  Filled 2022-06-13: qty 500

## 2022-06-13 MED ORDER — OXYCODONE-ACETAMINOPHEN 5-325 MG PO TABS: 1.0000 | ORAL_TABLET | ORAL | Status: DC | PRN

## 2022-06-13 MED ORDER — ACETAMINOPHEN 325 MG PO TABS
650.0000 mg | ORAL_TABLET | ORAL | Status: DC | PRN
  Administered 2022-06-13: 650 mg via ORAL
  Filled 2022-06-13: qty 2

## 2022-06-13 MED ORDER — LIDOCAINE HCL (PF) 1 % IJ SOLN: 30.0000 mL | INTRAMUSCULAR | Status: DC | PRN

## 2022-06-13 MED ORDER — MISOPROSTOL 200 MCG PO TABS
800.0000 ug | ORAL_TABLET | Freq: Once | ORAL | Status: AC
  Administered 2022-06-13: 800 ug via RECTAL

## 2022-06-13 MED ORDER — OXYTOCIN BOLUS FROM INFUSION
333.0000 mL | Freq: Once | INTRAVENOUS | Status: AC
  Administered 2022-06-13: 333 mL via INTRAVENOUS

## 2022-06-13 MED ORDER — SOD CITRATE-CITRIC ACID 500-334 MG/5ML PO SOLN: 30.0000 mL | ORAL | Status: DC | PRN

## 2022-06-13 MED ORDER — LACTATED RINGERS IV SOLN
500.0000 mL | Freq: Once | INTRAVENOUS | Status: AC
  Administered 2022-06-13: 500 mL via INTRAVENOUS

## 2022-06-13 MED ORDER — TRANEXAMIC ACID-NACL 1000-0.7 MG/100ML-% IV SOLN
1000.0000 mg | INTRAVENOUS | Status: AC
  Administered 2022-06-13: 1000 mg via INTRAVENOUS
  Filled 2022-06-13: qty 100

## 2022-06-13 MED ORDER — FENTANYL CITRATE (PF) 100 MCG/2ML IJ SOLN: 50.0000 ug | INTRAMUSCULAR | Status: DC | PRN

## 2022-06-13 MED ORDER — LACTATED RINGERS IV SOLN: INTRAVENOUS | Status: DC

## 2022-06-13 MED ORDER — SODIUM CHLORIDE 0.9 % IV SOLN
5.0000 10*6.[IU] | Freq: Once | INTRAVENOUS | Status: AC
  Administered 2022-06-13: 5 10*6.[IU] via INTRAVENOUS
  Filled 2022-06-13: qty 5

## 2022-06-13 MED ORDER — OXYCODONE-ACETAMINOPHEN 5-325 MG PO TABS: 2.0000 | ORAL_TABLET | ORAL | Status: DC | PRN

## 2022-06-13 MED ORDER — ONDANSETRON HCL 4 MG/2ML IJ SOLN
4.0000 mg | Freq: Four times a day (QID) | INTRAMUSCULAR | Status: DC | PRN
  Administered 2022-06-13: 4 mg via INTRAVENOUS
  Filled 2022-06-13: qty 2

## 2022-06-13 MED ORDER — LACTATED RINGERS IV SOLN: 500.0000 mL | Freq: Once | INTRAVENOUS | Status: DC

## 2022-06-13 MED ORDER — PENICILLIN G POT IN DEXTROSE 60000 UNIT/ML IV SOLN
3.0000 10*6.[IU] | INTRAVENOUS | Status: DC
  Administered 2022-06-13: 3 10*6.[IU] via INTRAVENOUS
  Filled 2022-06-13: qty 50

## 2022-06-13 MED ORDER — PHENYLEPHRINE 80 MCG/ML (10ML) SYRINGE FOR IV PUSH (FOR BLOOD PRESSURE SUPPORT)
80.0000 ug | PREFILLED_SYRINGE | INTRAVENOUS | Status: DC | PRN
  Filled 2022-06-13: qty 10

## 2022-06-13 NOTE — Anesthesia Procedure Notes (Signed)
Epidural Patient location during procedure: OB Start time: 06/13/2022 7:33 PM End time: 06/13/2022 7:40 PM  Staffing Anesthesiologist: Santa Lighter, MD Performed: anesthesiologist   Preanesthetic Checklist Completed: patient identified, IV checked, risks and benefits discussed, monitors and equipment checked, pre-op evaluation and timeout performed  Epidural Patient position: sitting Prep: DuraPrep Patient monitoring: blood pressure and continuous pulse ox Approach: midline Location: L3-L4 Injection technique: LOR air  Needle:  Needle type: Tuohy  Needle gauge: 17 G Needle length: 9 cm Needle insertion depth: 5 cm Catheter size: 19 Gauge Catheter at skin depth: 10 cm Test dose: negative and Other (1% Lidocaine)  Additional Notes Patient identified.  Risk benefits discussed including failed block, incomplete pain control, headache, nerve damage, paralysis, blood pressure changes, nausea, vomiting, reactions to medication both toxic or allergic, and postpartum back pain.  Patient expressed understanding and wished to proceed.  All questions were answered.  Sterile technique used throughout procedure and epidural site dressed with sterile barrier dressing. No paresthesia or other complications noted. The patient did not experience any signs of intravascular injection such as tinnitus or metallic taste in mouth nor signs of intrathecal spread such as rapid motor block. Please see nursing notes for vital signs. Reason for block:procedure for pain

## 2022-06-13 NOTE — Progress Notes (Signed)
Patient Vitals for the past 4 hrs:  BP Temp Temp src Pulse Resp SpO2  06/13/22 2130 128/80 -- -- 76 -- --  06/13/22 2100 126/75 -- -- 78 -- --  06/13/22 2030 126/78 -- -- 76 -- 99 %  06/13/22 2020 119/68 -- -- 80 -- 98 %  06/13/22 2015 120/70 -- -- 77 -- 98 %  06/13/22 2010 127/74 -- -- 85 -- 98 %  06/13/22 2005 121/69 -- -- 79 -- 97 %  06/13/22 2000 122/75 -- -- 85 -- 98 %  06/13/22 1955 120/72 -- -- 79 -- 96 %  06/13/22 1950 122/80 -- -- 86 -- 96 %  06/13/22 1945 122/70 -- -- 83 -- 98 %  06/13/22 1941 124/79 -- -- 91 -- --  06/13/22 1935 -- -- -- -- -- 98 %  06/13/22 1918 130/78 98.3 F (36.8 C) Oral 80 16 --  06/13/22 1859 121/74 97.9 F (36.6 C) Oral 85 16 --  06/13/22 1800 -- 98.3 F (36.8 C) Oral -- -- --   Starting to have pain at umbilicus w/ctx.  No c/o pressure. Cx rim/100/0.  Ctx q 2-3 minutes. 2nd dose of PCN being hung now. Will give epidural bolus, sit pt up. Expect delivery soon.

## 2022-06-13 NOTE — H&P (Addendum)
OBSTETRIC ADMISSION HISTORY AND PHYSICAL  Judith Collins is a 27 y.o. female [phone] with IUP at [redacted]w[redacted]d by [place] presenting for SOL and SROM. She reports +FMs, No LOF, no VB, no blurry vision, headaches or peripheral edema, and RUQ pain.  She plans on breast and bottle feeding. She request paraguard for birth control. She received her prenatal care at [place]: By [place] --->  Estimated Date of Delivery: 06/11/22.  Sono:    @[redacted]w[redacted]d , CWD, cephalic presentation, [id number] EFW   Prenatal History/Complications:  - GBS + - H/o asthma - H/o postpartum hemorrhage  Past Medical History: Past Medical History:  Diagnosis Date   Asthma    Bronchitis     Past Surgical History: Past Surgical History:  Procedure Laterality Date   NO PAST SURGERIES      Obstetrical History: OB History     Gravida  3   Para  2   Term  1   Preterm  1   AB      Living  2      SAB      IAB      Ectopic      Multiple  0   Live Births  2           Social History Social History   Socioeconomic History   Marital status: Single    Spouse name: Not on file   Number of children: 1   Years of education: Not on file   Highest education level: Not on file  Occupational History   Not on file  Tobacco Use   Smoking status: Never   Smokeless tobacco: Never  Vaping Use   Vaping Use: Never used  Substance and Sexual Activity   Alcohol use: No   Drug use: No   Sexual activity: Yes    Birth control/protection: None  Other Topics Concern   Not on file  Social History Narrative   ** Merged History Encounter **       Social Determinants of Health   Financial Resource Strain: Not on file  Food Insecurity: Not on file  Transportation Needs: Not on file  Physical Activity: Not on file  Stress: Not on file  Social Connections: Not on file    Family History: Family History  Problem Relation Age of Onset   Asthma Mother     Allergies: Allergies  Allergen Reactions    Lactose Intolerance (Gi)     Medications Prior to Admission  Medication Sig Dispense Refill Last Dose   acetaminophen (TYLENOL) 500 MG tablet Take 2 tablets (1,000 mg total) by mouth every 6 (six) hours. 30 tablet 0    Prenatal Vit-Fe Fumarate-FA (PRENATAL VITAMIN PO) Take by mouth.        Review of Systems   All systems reviewed and negative except as stated in HPI  unknown if currently breastfeeding. General appearance: alert, cooperative, and no distress Lungs: clear to auscultation bilaterally Heart: regular rate and rhythm Abdomen: soft, non-tender; bowel sounds normal Pelvic: no lesions apparent Extremities: Trace edema Presentation: cephalic Fetal monitoringBaseline: 140 bpm, Variability: Good {> 6 bpm), and Accelerations: Reactive Uterine activityFrequency: Every 3-6 minutes, mild intensity     Prenatal labs: ABO, Rh:  B+ Antibody:  negative Rubella:  immune RPR:   nonreactive HBsAg:   nonreactive HIV:   nonreactive GBS:   positive 1 hr Glucola 83 Genetic screening  nl Anatomy US nl  Prenatal Transfer Tool  Maternal Diabetes: No Genetic  Screening: Normal Maternal Ultrasounds/Referrals: Normal Fetal Ultrasounds or other Referrals:  None Maternal Substance Abuse:  No Significant Maternal Medications:  None Significant Maternal Lab Results:  Group B Strep positive Number of Prenatal Visits:greater than 3 verified prenatal visits Other Comments:  None  No results found for this or any previous visit (from the past 24 hour(s)).  Patient Active Problem List   Diagnosis Date Noted   Preterm premature rupture of membranes (PPROM) delivered, current hospitalization 05/14/2020   Preterm delivery 05/14/2020   Indication for care in labor and delivery, antepartum 05/13/2020   History of postpartum hemorrhage 05/10/2019   Perforated tympanic membrane 02/26/2015   Failed hearing screening 02/26/2015    Assessment/Plan:  Judith Collins is a 27  y.o. G3P1102 at [redacted]w[redacted]d here for SOL, SROM.  #Labor:She seems to be progressing well on her own and feeling contractions. Will continue with expectant management. #Pain: Desires epidural, place per patient preference. #FWB: Cat 1 #ID:  GBS+, PCN #MOF: Breast and bottle #MOC:ParaGard, outpatient #Circ:  NA  Enid Baas, MD  06/13/2022, 3:18 PM ___ GME ATTESTATION:  Evaluation and management procedures were performed by the [facility] Medicine Resident under my supervision. I was immediately available for direct supervision, assistance and direction throughout this encounter.  I also confirm that I have verified the information documented in the resident's note, and that I have also personally reperformed the pertinent components of the physical exam and all of the medical decision making activities.  I have also made any necessary editorial changes.  Shelda Pal, DO OB Fellow, [place] for [place] 06/13/2022 7:50 PM

## 2022-06-13 NOTE — Discharge Summary (Signed)
Postpartum Discharge Summary  Patient Name: Judith Collins DOB: [birth year] MRN: [id number]  Date of admission: 06/13/2022 Delivery date:06/13/2022  Delivering provider: [name]  Date of discharge: 06/15/2022  Admitting diagnosis: PROM (premature rupture of membranes) [O42.90] Intrauterine pregnancy: [redacted]w[redacted]d     Secondary diagnosis:  Principal Problem:   Normal labor Active Problems:   History of postpartum hemorrhage   GBS (group B Streptococcus carrier), +RV culture, currently pregnant  Additional problems: Significant post partum bleeding, ~905cc.    Discharge diagnosis: Term Pregnancy Delivered                                              Post partum procedures: None Augmentation: none Complications: None  Hospital course: Onset of Labor With Vaginal Delivery      27 y.o. yo [id number] at [redacted]w[redacted]d was admitted in Active Labor on 06/13/2022. Labor course was complicated by nothing  Membrane Rupture Time/Date: 11:00 AM ,06/13/2022   Delivery Method:Vaginal, Spontaneous  Episiotomy: None  Lacerations:  None  Patient had a postpartum course complicated by: none.  She is ambulating, tolerating a regular diet, passing flatus, and urinating well. Patient is discharged home in stable condition on 06/15/22.  Newborn Data: Birth date:06/13/2022  Birth time:10:57 PM  Gender:Female  Living status:Living  Apgars:9 ,9  Weight:3710 g   Magnesium Sulfate received: No BMZ received: No Rhophylac:N/A MMR:N/A T-DaP:Given prenatally Flu: N/A Transfusion:No  Physical exam  Vitals:   06/14/22 0838 06/14/22 1409 06/14/22 2135 06/15/22 0505  BP: 112/72 115/73 114/74 102/64  Pulse: 80 74 85 91  Resp: 18 18 19 18   Temp: 98 F (36.7 C) 98.2 F (36.8 C) 98.5 F (36.9 C) 98.4 F (36.9 C)  TempSrc:  Oral Oral Oral  SpO2: 100% 98% 98%   Weight:      Height:       General: alert, cooperative, and no distress Lochia: appropriate Uterine Fundus: firm Incision:  N/A DVT Evaluation: No evidence of DVT seen on physical exam. Labs: Lab Results  Component Value Date   WBC 12.6 (H) 06/14/2022   HGB 12.0 06/14/2022   HCT 34.4 (L) 06/14/2022   MCV 89.6 06/14/2022   PLT 176 06/14/2022      Latest Ref Rng & Units 02/16/2015    8:15 PM  CMP  Glucose 65 - 99 mg/dL 102   BUN 6 - 20 mg/dL 13   Creatinine 0.50 - 1.00 mg/dL 0.63   Sodium 135 - 145 mmol/L 139   Potassium 3.5 - 5.1 mmol/L 4.4   Chloride 101 - 111 mmol/L 106   CO2 22 - 32 mmol/L 25   Calcium 8.9 - 10.3 mg/dL 9.8   Total Protein 6.5 - 8.1 g/dL 7.6   Total Bilirubin 0.3 - 1.2 mg/dL 0.4   Alkaline Phos 47 - 119 U/L 113   AST 15 - 41 U/L 18   ALT 14 - 54 U/L 11    Edinburgh Score:    06/14/2022    5:38 AM  Edinburgh Postnatal Depression Scale Screening Tool  I have been able to laugh and see the funny side of things. 0  I have looked forward with enjoyment to things. 0  I have blamed myself unnecessarily when things went wrong. 1  I have been anxious or worried for no good reason. 0  I have felt scared or  panicky for no good reason. 0  Things have been getting on top of me. 1  I have been so unhappy that I have had difficulty sleeping. 0  I have felt sad or miserable. 0  I have been so unhappy that I have been crying. 0  The thought of harming myself has occurred to me. 0  Edinburgh Postnatal Depression Scale Total 2     After visit meds:  Allergies as of 06/15/2022       Reactions   Lactose Intolerance (gi)         Medication List     TAKE these medications    acetaminophen 500 MG tablet Commonly known as: TYLENOL Take 1 tablet (500 mg total) by mouth every 8 (eight) hours as needed. What changed:  how much to take when to take this reasons to take this   ibuprofen 600 MG tablet Commonly known as: ADVIL Take 1 tablet (600 mg total) by mouth every 8 (eight) hours as needed. With meals   PRENATAL VITAMIN PO Take by mouth.   senna-docusate 8.6-50 MG  tablet Commonly known as: Senokot-S Take 2 tablets by mouth daily. Start taking on: June 16, 2022        Discharge home in stable condition Infant Feeding: Bottle and Breast Infant Disposition:home with mother Discharge instruction: per After Visit Summary and Postpartum booklet. Activity: Advance as tolerated. Pelvic rest for 6 weeks.  Diet: routine diet Future Appointments:No future appointments. Follow up Visit:   PP care at [facility] MD MPH OB Fellow, [place] for [place] 06/15/2022

## 2022-06-13 NOTE — Progress Notes (Signed)
Labor Progress Note  In-person interpretor was present for this encounter for 15 minutes.   Judith Collins is a 27 y.o. [phone] at [redacted]w[redacted]d presented for PROM  S: feeling a bit cramping on the left side still.   O:  BP 126/78   Pulse 76   Temp 98.3 F (36.8 C) (Oral)   Resp 16   Ht 5\' 3"  (1.6 m)   Wt 96.9 kg   SpO2 99%   BMI 37.84 kg/m   EFM: baseline 135bpm / moderate variability/ no accels/ Early decels and occasional variable Toco: q4 mins  CVE: Dilation: 5 Effacement (%): 90 Station: -2 Presentation: Vertex Exam by:: Dr. Donnita Falls   A&P: 27 y.o. [id number] [redacted]w[redacted]d  here for PROM / spontaneous labor as above  #Labor: Progressing well. Attempted to rupture forebag and no more forebag on cervical exam. Very soft and thin cervix. Anticipate quick transition. Plan for expectant management for a bit with hope to get another dose of PCN before augment. CTX pattern improving. #Pain:  Epidural #FWB: CAT 1 #GBS positive on PCN  Seen with [name], CNM [name] "[name], M.D. PGY-2 Family Medicine Visiting Resident Faculty Practice 06/13/2022 8:49 PM

## 2022-06-13 NOTE — Anesthesia Preprocedure Evaluation (Addendum)
Anesthesia Evaluation  Patient identified by MRN, date of birth, ID band Patient awake    Reviewed: Allergy & Precautions, NPO status , Patient's Chart, lab work & pertinent test results  Airway Mallampati: II  TM Distance: >3 FB Neck ROM: Full    Dental  (+) Teeth Intact, Dental Advisory Given   Pulmonary asthma    Pulmonary exam normal breath sounds clear to auscultation       Cardiovascular negative cardio ROS Normal cardiovascular exam Rhythm:Regular Rate:Normal     Neuro/Psych negative neurological ROS     GI/Hepatic negative GI ROS, Neg liver ROS,,,  Endo/Other  Obesity   Renal/GU negative Renal ROS     Musculoskeletal negative musculoskeletal ROS (+)    Abdominal   Peds  Hematology  (+) Blood dyscrasia, anemia Plt 197k   Anesthesia Other Findings Day of surgery medications reviewed with the patient.  Reproductive/Obstetrics (+) Pregnancy                             Anesthesia Physical Anesthesia Plan  ASA: 2  Anesthesia Plan: Epidural   Post-op Pain Management: Minimal or no pain anticipated   Induction:   PONV Risk Score and Plan: 2 and Treatment may vary due to age or medical condition  Airway Management Planned: Natural Airway  Additional Equipment:   Intra-op Plan:   Post-operative Plan:   Informed Consent: I have reviewed the patients History and Physical, chart, labs and discussed the procedure including the risks, benefits and alternatives for the proposed anesthesia with the patient or authorized representative who has indicated his/her understanding and acceptance.     Dental advisory given and Interpreter used for interveiw  Plan Discussed with:   Anesthesia Plan Comments: (Patient identified. Risks/Benefits/Options discussed with patient including but not limited to bleeding, infection, nerve damage, paralysis, failed block, incomplete pain control,  headache, blood pressure changes, nausea, vomiting, reactions to medication both or allergic, itching and postpartum back pain. Confirmed with bedside nurse the patient's most recent platelet count. Confirmed with patient that they are not currently taking any anticoagulation, have any bleeding history or any family history of bleeding disorders. Patient expressed understanding and wished to proceed. All questions were answered. )       Anesthesia Quick Evaluation

## 2022-06-14 ENCOUNTER — Encounter (HOSPITAL_COMMUNITY): Payer: Self-pay | Admitting: Family Medicine

## 2022-06-14 LAB — CBC
HCT: 33.8 % — ABNORMAL LOW (ref 36.0–46.0)
Hemoglobin: 11.7 g/dL — ABNORMAL LOW (ref 12.0–15.0)
MCH: 31.1 pg (ref 26.0–34.0)
MCHC: 34.6 g/dL (ref 30.0–36.0)
MCV: 89.9 fL (ref 80.0–100.0)
Platelets: 165 10*3/uL (ref 150–400)
RBC: 3.76 MIL/uL — ABNORMAL LOW (ref 3.87–5.11)
RDW: 13.3 % (ref 11.5–15.5)
WBC: 14.8 10*3/uL — ABNORMAL HIGH (ref 4.0–10.5)
nRBC: 0 % (ref 0.0–0.2)

## 2022-06-14 LAB — CBC WITH DIFFERENTIAL/PLATELET
Abs Immature Granulocytes: 0.08 10*3/uL — ABNORMAL HIGH (ref 0.00–0.07)
Basophils Absolute: 0 10*3/uL (ref 0.0–0.1)
Basophils Relative: 0 %
Eosinophils Absolute: 0.1 10*3/uL (ref 0.0–0.5)
Eosinophils Relative: 1 %
HCT: 34.4 % — ABNORMAL LOW (ref 36.0–46.0)
Hemoglobin: 12 g/dL (ref 12.0–15.0)
Immature Granulocytes: 1 %
Lymphocytes Relative: 13 %
Lymphs Abs: 1.6 10*3/uL (ref 0.7–4.0)
MCH: 31.3 pg (ref 26.0–34.0)
MCHC: 34.9 g/dL (ref 30.0–36.0)
MCV: 89.6 fL (ref 80.0–100.0)
Monocytes Absolute: 1 10*3/uL (ref 0.1–1.0)
Monocytes Relative: 8 %
Neutro Abs: 9.9 10*3/uL — ABNORMAL HIGH (ref 1.7–7.7)
Neutrophils Relative %: 77 %
Platelets: 176 10*3/uL (ref 150–400)
RBC: 3.84 MIL/uL — ABNORMAL LOW (ref 3.87–5.11)
RDW: 13.2 % (ref 11.5–15.5)
WBC: 12.6 10*3/uL — ABNORMAL HIGH (ref 4.0–10.5)
nRBC: 0 % (ref 0.0–0.2)

## 2022-06-14 LAB — RPR: RPR Ser Ql: NONREACTIVE

## 2022-06-14 MED ORDER — DIBUCAINE (PERIANAL) 1 % EX OINT: 1.0000 | TOPICAL_OINTMENT | CUTANEOUS | Status: DC | PRN

## 2022-06-14 MED ORDER — SIMETHICONE 80 MG PO CHEW: 80.0000 mg | CHEWABLE_TABLET | ORAL | Status: DC | PRN

## 2022-06-14 MED ORDER — ONDANSETRON HCL 4 MG PO TABS: 4.0000 mg | ORAL_TABLET | ORAL | Status: DC | PRN

## 2022-06-14 MED ORDER — OXYCODONE HCL 5 MG PO TABS: 10.0000 mg | ORAL_TABLET | ORAL | Status: DC | PRN

## 2022-06-14 MED ORDER — IBUPROFEN 600 MG PO TABS
600.0000 mg | ORAL_TABLET | Freq: Four times a day (QID) | ORAL | Status: DC
  Administered 2022-06-14 – 2022-06-15 (×4): 600 mg via ORAL
  Filled 2022-06-14 (×5): qty 1

## 2022-06-14 MED ORDER — METHYLERGONOVINE MALEATE 0.2 MG PO TABS
0.2000 mg | ORAL_TABLET | ORAL | Status: AC
  Administered 2022-06-14 (×3): 0.2 mg via ORAL
  Filled 2022-06-14 (×3): qty 1

## 2022-06-14 MED ORDER — OXYCODONE HCL 5 MG PO TABS: 5.0000 mg | ORAL_TABLET | ORAL | Status: DC | PRN

## 2022-06-14 MED ORDER — ACETAMINOPHEN 325 MG PO TABS
650.0000 mg | ORAL_TABLET | ORAL | Status: DC | PRN
  Administered 2022-06-14: 650 mg via ORAL
  Filled 2022-06-14: qty 2

## 2022-06-14 MED ORDER — DIPHENHYDRAMINE HCL 25 MG PO CAPS: 25.0000 mg | ORAL_CAPSULE | Freq: Four times a day (QID) | ORAL | Status: DC | PRN

## 2022-06-14 MED ORDER — SENNOSIDES-DOCUSATE SODIUM 8.6-50 MG PO TABS
2.0000 | ORAL_TABLET | ORAL | Status: DC
  Administered 2022-06-15: 2 via ORAL
  Filled 2022-06-14 (×2): qty 2

## 2022-06-14 MED ORDER — BENZOCAINE-MENTHOL 20-0.5 % EX AERO
1.0000 | INHALATION_SPRAY | CUTANEOUS | Status: DC | PRN
  Filled 2022-06-14: qty 56

## 2022-06-14 MED ORDER — WITCH HAZEL-GLYCERIN EX PADS: 1.0000 | MEDICATED_PAD | CUTANEOUS | Status: DC | PRN

## 2022-06-14 MED ORDER — COCONUT OIL OIL: 1.0000 | TOPICAL_OIL | Status: DC | PRN

## 2022-06-14 MED ORDER — TETANUS-DIPHTH-ACELL PERTUSSIS 5-2.5-18.5 LF-MCG/0.5 IM SUSY: 0.5000 mL | PREFILLED_SYRINGE | Freq: Once | INTRAMUSCULAR | Status: DC

## 2022-06-14 MED ORDER — ONDANSETRON HCL 4 MG/2ML IJ SOLN: 4.0000 mg | INTRAMUSCULAR | Status: DC | PRN

## 2022-06-14 MED ORDER — ZOLPIDEM TARTRATE 5 MG PO TABS: 5.0000 mg | ORAL_TABLET | Freq: Every evening | ORAL | Status: DC | PRN

## 2022-06-14 MED ORDER — PRENATAL MULTIVITAMIN CH
1.0000 | ORAL_TABLET | Freq: Every day | ORAL | Status: DC
  Administered 2022-06-14 – 2022-06-15 (×2): 1 via ORAL
  Filled 2022-06-14 (×2): qty 1

## 2022-06-14 NOTE — Progress Notes (Addendum)
Post Partum Day 1 Subjective: no complaints, up ad lib, voiding and tolerating PO, small lochia, plans to breastfeed, plans to bottle feed, IUD  Objective: Blood pressure 115/73, pulse 74, temperature 98.2 F (36.8 C), temperature source Oral, resp. rate 18, height 5\' 3"  (1.6 m), weight 96.9 kg, SpO2 98 %, unknown if currently breastfeeding.  Physical Exam:  General: alert, cooperative and no distress Lochia:normal flow Chest: CTAB Heart: RRR no m/r/g Abdomen: +BS, soft, nontender,  Uterine Fundus: firm DVT Evaluation: No evidence of DVT seen on physical exam. Extremities: no edema  Recent Labs    06/14/22 0521 06/14/22 1046  HGB 11.7* 12.0  HCT 33.8* 34.4*    Assessment/Plan: v  Patient Vitals for the past 4 hrs:  BP Temp Temp src Pulse Resp SpO2  06/14/22 0510 124/80 98.2 F (36.8 C) Oral 76 18 100 %   Feels well, no dizziness.  Corey Skains turned off an hour ago, FF, lochia minimal.  HgB this am exact same as predelivery, 11.7.  Will removed Jada/foley/epidural catheter  LOS: 1 day   [name] 06/14/2022, 4:00 PM

## 2022-06-14 NOTE — Progress Notes (Signed)
At 1400 mom  was on the couch and stated she had voided again for 2nd time. Mom states she is not dizzy and she has seen no clots and her bleeding has gotten better, FOB asked her those questions for me. She states she feels fine.

## 2022-06-14 NOTE — Anesthesia Postprocedure Evaluation (Signed)
Anesthesia Post Note  Patient: Judith Collins  Procedure(s) Performed: AN AD [place]     Patient location during evaluation: Mother Baby Anesthesia Type: Epidural Level of consciousness: awake, oriented and awake and alert Pain management: pain level controlled Vital Signs Assessment: post-procedure vital signs reviewed and stable Respiratory status: spontaneous breathing, respiratory function stable and nonlabored ventilation Cardiovascular status: stable Postop Assessment: no headache, adequate PO intake, able to ambulate, patient able to bend at knees and no apparent nausea or vomiting Anesthetic complications: no   No notable events documented.  Last Vitals:  Vitals:   06/14/22 0510 06/14/22 0838  BP: 124/80 112/72  Pulse: 76 80  Resp: 18 18  Temp: 36.8 C 36.7 C  SpO2: 100% 100%    Last Pain:  Vitals:   06/14/22 0622  TempSrc:   PainSc: 0-No pain   Pain Goal:                   Jayro Mcmath

## 2022-06-14 NOTE — Progress Notes (Signed)
Check on pt, ordered meals By Juliann Mule Spanish Medical Interpreter.

## 2022-06-14 NOTE — Lactation Note (Signed)
This note was copied from a baby's chart. Lactation Consultation Note  Patient Name: Judith Collins [id number] Date: 06/14/2022 Age:25 hours  Mom chooses to formula feed.   Maternal Data    Feeding Nipple Type: Slow - flow  LATCH Score                    Lactation Tools Discussed/Used    Interventions    Discharge    Consult Status Consult Status: Complete    Graycee Greeson G 06/14/2022, 3:35 AM

## 2022-06-14 NOTE — Progress Notes (Signed)
Manus Gunning CNM called and notified upon fundal check at 0830 am I noticed a slight trickle however fundus was firm. She had stated it was ok to still take foley out. Foley removed, patient up to bathrooom without any dizzyness she did not void. Live interpreter used to ask mom if she felt dizzy, she stated she didn't. A small quarter sized clot came out when I did peri care, however when I palpated her again she had no active gushing. Patient walking around in room without difficulty and was told to call RN if she started seeing a lot of clots or feeling bad. L & D RN took epidural catheter out.

## 2022-06-15 MED ORDER — IBUPROFEN 600 MG PO TABS: 600.0000 mg | ORAL_TABLET | Freq: Three times a day (TID) | ORAL | 0 refills | Status: AC | PRN

## 2022-06-15 MED ORDER — ACETAMINOPHEN 500 MG PO TABS: 500.0000 mg | ORAL_TABLET | Freq: Three times a day (TID) | ORAL | 0 refills | Status: AC | PRN

## 2022-06-15 MED ORDER — SENNOSIDES-DOCUSATE SODIUM 8.6-50 MG PO TABS: 2.0000 | ORAL_TABLET | ORAL | 0 refills | Status: AC

## 2022-06-15 NOTE — Discharge Instructions (Signed)
-   Contine con sus vitaminas prenatales, especialmente si est amamantando. - Intenta comer alimentos ricos en hierro. - Tome Tylenol (500 mg) o ibuprofeno (200 mg) de venta libre tres veces al da segn sea necesario para los calambres o el dolor. - Seguimiento en la clnica en 4 a 6 semanas segn lo programado para su visita regular posparto. - Por favor regrese a MAU si nota presin arterial elevada persistentemente o comienza a tener dolor de cabeza, que no mejora con medicamentos (tylenol e ibuprofeno), descanso (4 horas de sueo) y agua potable.  English Version - Continue your prenatal vitamins especially if breastfeeding - Try to eat iron rich food. - Take over the counter tylenol (500mg) or ibuprofen (200mg) three times a day as needed for cramping/pain. - Follow up in clinic in 4-6 weeks as scheduled for your regular post partum visit. - Please come back to MAU if you notice persistently elevated blood pressures or you start to have a headache, that doesn't get better with medications (tylenol and ibuprofen), rest (4hrs of sleep) and drinking water.  

## 2022-06-16 ENCOUNTER — Telehealth (HOSPITAL_COMMUNITY): Payer: Self-pay

## 2022-06-16 NOTE — Telephone Encounter (Signed)
Chart review.

## 2022-06-23 ENCOUNTER — Telehealth (HOSPITAL_COMMUNITY): Payer: Self-pay | Admitting: *Deleted

## 2022-06-23 NOTE — Telephone Encounter (Signed)
Attempted Hospital Discharge Follow-Up Call with help of interpreter "Roxan Hockey [phone]".  No answer on patient phone, voice mail box not set up.  Unable to leave a message.
